# Patient Record
Sex: Female | Born: 1965
Health system: Southern US, Community
[De-identification: ages and names within clinical notes are randomized; demographics above are authoritative.]

## PROBLEM LIST (undated history)

## (undated) DIAGNOSIS — Z8489 Family history of other specified conditions: Secondary | ICD-10-CM

## (undated) DIAGNOSIS — I10 Essential (primary) hypertension: Secondary | ICD-10-CM

## (undated) DIAGNOSIS — E785 Hyperlipidemia, unspecified: Secondary | ICD-10-CM

## (undated) HISTORY — PX: ABDOMINAL HYSTERECTOMY: SHX81

## (undated) HISTORY — PX: KNEE ARTHROPLASTY: SHX992

---

## 1989-04-16 HISTORY — PX: ECTOPIC PREGNANCY SURGERY: SHX613

## 2005-07-16 ENCOUNTER — Ambulatory Visit: Payer: Self-pay | Admitting: Internal Medicine

## 2007-02-03 ENCOUNTER — Ambulatory Visit: Payer: Self-pay

## 2007-02-25 ENCOUNTER — Ambulatory Visit: Payer: Self-pay | Admitting: Orthopaedic Surgery

## 2007-03-04 ENCOUNTER — Ambulatory Visit: Payer: Self-pay | Admitting: Orthopaedic Surgery

## 2007-10-23 ENCOUNTER — Ambulatory Visit: Payer: Self-pay | Admitting: Nurse Practitioner

## 2007-11-05 ENCOUNTER — Ambulatory Visit: Payer: Self-pay | Admitting: Nurse Practitioner

## 2009-11-29 ENCOUNTER — Ambulatory Visit: Payer: Self-pay | Admitting: Nurse Practitioner

## 2012-06-25 ENCOUNTER — Ambulatory Visit: Payer: Self-pay | Admitting: Internal Medicine

## 2012-08-25 ENCOUNTER — Ambulatory Visit: Payer: Self-pay | Admitting: Internal Medicine

## 2014-02-14 ENCOUNTER — Ambulatory Visit: Payer: Self-pay | Admitting: Family Medicine

## 2014-02-26 ENCOUNTER — Ambulatory Visit: Payer: Self-pay | Admitting: Family Medicine

## 2014-08-03 ENCOUNTER — Ambulatory Visit
Admit: 2014-08-03 | Disposition: A | Discharge status: Home / Self Care | Payer: Self-pay | Attending: Family Medicine | Admitting: Family Medicine

## 2014-08-04 DIAGNOSIS — I10 Essential (primary) hypertension: Secondary | ICD-10-CM | POA: Insufficient documentation

## 2015-01-24 ENCOUNTER — Encounter: Payer: Self-pay | Admitting: Physician Assistant

## 2015-01-24 ENCOUNTER — Ambulatory Visit: Payer: Self-pay | Admitting: Physician Assistant

## 2015-01-24 VITALS — BP 120/82 | Temp 99.1°F | Wt 137.0 lb

## 2015-01-24 DIAGNOSIS — J069 Acute upper respiratory infection, unspecified: Secondary | ICD-10-CM

## 2015-01-24 MED ORDER — AZITHROMYCIN 250 MG PO TABS: ORAL_TABLET | ORAL | Status: DC

## 2015-01-24 NOTE — Progress Notes (Signed)
S: C/o headache and dry cough for 3 days, no fever, chills, cp/sob, v/d; mucus  thick, cough is sporadic, c/o of facial and dental pain. Had flu shot last week  Using otc meds:   O: PE: perrl eomi, normocephalic, tms dull, nasal mucosa red and swollen, throat injected, neck supple no lymph, lungs c t a, cv rrr, neuro intact  A:  Acute sinusitis   P: zpack, drink fluids, continue regular meds , use otc meds of choice, return if not improving in 5 days, return earlier if worsening

## 2015-05-21 ENCOUNTER — Ambulatory Visit
Admission: EM | Admit: 2015-05-21 | Discharge: 2015-05-21 | Disposition: A | Discharge status: Home / Self Care | Payer: 59 | Attending: Family Medicine | Admitting: Family Medicine

## 2015-05-21 DIAGNOSIS — N390 Urinary tract infection, site not specified: Secondary | ICD-10-CM | POA: Diagnosis not present

## 2015-05-21 LAB — URINALYSIS COMPLETE WITH MICROSCOPIC (ARMC ONLY)
BILIRUBIN URINE: NEGATIVE
GLUCOSE, UA: NEGATIVE mg/dL
Ketones, ur: NEGATIVE mg/dL
NITRITE: NEGATIVE
PH: 8.5 — AB (ref 5.0–8.0)
Protein, ur: 30 mg/dL — AB
SPECIFIC GRAVITY, URINE: 1.02 (ref 1.005–1.030)
Squamous Epithelial / LPF: NONE SEEN

## 2015-05-21 MED ORDER — NITROFURANTOIN MONOHYD MACRO 100 MG PO CAPS: 100.0000 mg | ORAL_CAPSULE | Freq: Two times a day (BID) | ORAL | Status: DC

## 2015-05-21 NOTE — ED Provider Notes (Signed)
[facility]  ____________________________________________  Time seen: Approximately 9:25 AM  I have reviewed the triage vital signs and the nursing notes.   HISTORY  Chief Complaint Urinary Tract Infection   HPI Barbara Welch is a 50 y.o. female presents for complaints of 2 days of urinary frequency, urinary urgency and occasional burning with urination. Also reports that she noticed some blood in urination. Denies other abnormal bleeding. States that she occasionally has some pressure in lower pelvis but denies pain. Denies pain or discomfort in absence of actively urinating. Denies fever. Reports has continued to eat and drink well. Patient reports she has had one urinary tract infection in the past several years ago and it felt similar. Denies pain at this time.  Denies chest pain or shortness breath, abdominal pain, fevers, back pain, flank pain, trauma. Denies recent antibiotic use. Denies known trigger for urinary tract infection.   No past medical history on file.  There are no active problems to display for this patient.   Surgical history on file.  Hysterectomy    Current Outpatient Rx  Name  Route  Sig  Dispense  Refill  . amLODipine (NORVASC) 10 MG tablet   Oral   Take 10 mg by mouth daily.         Marland Kitchen aspirin EC 81 MG tablet   Oral   Take 81 mg by mouth daily.         Marland Kitchen atorvastatin (LIPITOR) 10 MG tablet   Oral   Take 10 mg by mouth daily.         . calcium citrate-vitamin D (CITRACAL+D) 315-200 MG-UNIT tablet   Oral   Take 1 tablet by mouth 2 (two) times daily.         . hydrochlorothiazide (HYDRODIURIL) 12.5 MG tablet   Oral   Take 12.5 mg by mouth daily.         .           .             Allergies Review of patient's allergies indicates no known allergies.  No family history on file.  Social History Social History  Substance Use Topics  . Smoking status: Former Games developer  . Smokeless tobacco: None  . Alcohol Use: 0.0  oz/week    0 Standard drinks or equivalent per week     Comment: occasional    Review of Systems Constitutional: No fever/chills Eyes: No visual changes. ENT: No sore throat. Cardiovascular: Denies chest pain. Respiratory: Denies shortness of breath. Gastrointestinal: No abdominal pain.  No nausea, no vomiting.  No diarrhea.  No constipation. Genitourinary: positive for dysuria. Musculoskeletal: Negative for back pain. Skin: Negative for rash. Neurological: Negative for headaches, focal weakness or numbness.  10-point ROS otherwise negative.  ____________________________________________   PHYSICAL EXAM:  VITAL SIGNS: ED Triage Vitals  Enc Vitals Group     BP 05/21/15 0915 114/76 mmHg     Pulse Rate 05/21/15 0915 73     Resp 05/21/15 0915 16     Temp 05/21/15 0915 98.4 F (36.9 C)     Temp Source 05/21/15 0915 Oral     SpO2 05/21/15 0915 100 %     Weight 05/21/15 0915 137 lb (62.143 kg)     Height 05/21/15 0915  (1.651 m)     Head Cir --      Peak Flow --      Pain Score 05/21/15 0917 3     Pain  Loc --      Pain Edu? --      Excl. in GC? --     Constitutional: Alert and oriented. Well appearing and in no acute distress. Eyes: Conjunctivae are normal. PERRL. EOMI. Head: Atraumatic.  Nose: No congestion/rhinnorhea.  Mouth/Throat: Mucous membranes are moist.   Neck: No stridor.  No cervical spine tenderness to palpation. Hematological/Lymphatic/Immunilogical: No cervical lymphadenopathy. Cardiovascular: Normal rate, regular rhythm. Grossly normal heart sounds.  Good peripheral circulation. Respiratory: Normal respiratory effort.  No retractions. Lungs CTAB. Gastrointestinal: Soft and nontender. No distention. Normal Bowel sounds. No CVA tenderness. Musculoskeletal: No lower or upper extremity tenderness nor edema.  Neurologic:  Normal speech and language. No gross focal neurologic deficits are appreciated. No gait instability. Skin:  Skin is warm, dry and  intact. No rash noted. Psychiatric: Mood and affect are normal. Speech and behavior are normal.  ____________________________________________   LABS (all labs ordered are listed, but only abnormal results are displayed)  Labs Reviewed  URINALYSIS COMPLETEWITH MICROSCOPIC ([facility] ONLY) - Abnormal; Notable for the following:    APPearance CLOUDY (*)    Hgb urine dipstick 3+ (*)    pH 8.5 (*)    Protein, ur 30 (*)    Leukocytes, UA 2+ (*)    Bacteria, UA FEW (*)    All other components within normal limits  URINE CULTURE     INITIAL IMPRESSION / ASSESSMENT AND PLAN / ED COURSE  Pertinent labs & imaging results that were available during my care of the patient were reviewed by me and considered in my medical decision making (see chart for details).  Very well-appearing patient. No acute distress. Presents for the complaints of 2 days of dysuria. Lungs clear throughout. Abdomen soft and nontender. No CVA tenderness. Urinalysis positive for few bacteria, 2+ leukocytes, cloudy appearance, 3+ hemoglobin, 30 protein and no epithelial cells. Will culture urine. Encouraged rest, fluids. Will treat with oral Macrobid. Encourage PCP follow-up to ensure resolution in 1 week.  Discussed follow up with Primary care physician this week. Discussed follow up and return parameters including no resolution or any worsening concerns. Patient verbalized understanding and agreed to plan.   ____________________________________________   FINAL CLINICAL IMPRESSION(S) / ED DIAGNOSES  Final diagnoses:  UTI (lower urinary tract infection)      Note: This dictation was prepared with Dragon dictation along with smaller phrase technology. Any transcriptional errors that result from this process are unintentional.    Renford Dills, NP 05/21/15 [phone]

## 2015-05-21 NOTE — ED Notes (Signed)
Frequency, urgency, hematuria, dysuria x 2 days. Denies fever and flank pain.

## 2015-05-21 NOTE — Discharge Instructions (Signed)
Take medication as prescribed. Rest. Drink plenty of fluids.  ° °Follow up with your primary care physician this week as needed. Return to Urgent care for new or worsening concerns.  ° ° °Urinary Tract Infection °Urinary tract infections (UTIs) can develop anywhere along your urinary tract. Your urinary tract is your body's drainage system for removing wastes and extra water. Your urinary tract includes two kidneys, two ureters, a bladder, and a urethra. Your kidneys are a pair of bean-shaped organs. Each kidney is about the size of your fist. They are located below your ribs, one on each side of your spine. °CAUSES °Infections are caused by microbes, which are microscopic organisms, including fungi, viruses, and bacteria. These organisms are so small that they can only be seen through a microscope. Bacteria are the microbes that most commonly cause UTIs. °SYMPTOMS  °Symptoms of UTIs may vary by age and gender of the patient and by the location of the infection. Symptoms in young women typically include a frequent and intense urge to urinate and a painful, burning feeling in the bladder or urethra during urination. Older women and men are more likely to be tired, shaky, and weak and have muscle aches and abdominal pain. A fever may mean the infection is in your kidneys. Other symptoms of a kidney infection include pain in your back or sides below the ribs, nausea, and vomiting. °DIAGNOSIS °To diagnose a UTI, your caregiver will ask you about your symptoms. Your caregiver will also ask you to provide a urine sample. The urine sample will be tested for bacteria and white blood cells. White blood cells are made by your body to help fight infection. °TREATMENT  °Typically, UTIs can be treated with medication. Because most UTIs are caused by a bacterial infection, they usually can be treated with the use of antibiotics. The choice of antibiotic and length of treatment depend on your symptoms and the type of bacteria  causing your infection. °HOME CARE INSTRUCTIONS °· If you were prescribed antibiotics, take them exactly as your caregiver instructs you. Finish the medication even if you feel better after you have only taken some of the medication. °· Drink enough water and fluids to keep your urine clear or pale yellow. °· Avoid caffeine, tea, and carbonated beverages. They tend to irritate your bladder. °· Empty your bladder often. Avoid holding urine for long periods of time. °· Empty your bladder before and after sexual intercourse. °· After a bowel movement, women should cleanse from front to back. Use each tissue only once. °SEEK MEDICAL CARE IF:  °· You have back pain. °· You develop a fever. °· Your symptoms do not begin to resolve within 3 days. °SEEK IMMEDIATE MEDICAL CARE IF:  °· You have severe back pain or lower abdominal pain. °· You develop chills. °· You have nausea or vomiting. °· You have continued burning or discomfort with urination. °MAKE SURE YOU:  °· Understand these instructions. °· Will watch your condition. °· Will get help right away if you are not doing well or get worse. °  °This information is not intended to replace advice given to you by your health care provider. Make sure you discuss any questions you have with your health care provider. °  °Document Released: 01/10/2005 Document Revised: 12/22/2014 Document Reviewed: 05/11/2011 °Elsevier Interactive Patient Education ©2016 [employer] ° °

## 2015-05-23 LAB — URINE CULTURE

## 2015-07-26 DIAGNOSIS — I10 Essential (primary) hypertension: Secondary | ICD-10-CM | POA: Diagnosis not present

## 2015-07-26 DIAGNOSIS — E7800 Pure hypercholesterolemia, unspecified: Secondary | ICD-10-CM | POA: Diagnosis not present

## 2015-08-09 DIAGNOSIS — R945 Abnormal results of liver function studies: Secondary | ICD-10-CM | POA: Diagnosis not present

## 2015-08-09 DIAGNOSIS — R7989 Other specified abnormal findings of blood chemistry: Secondary | ICD-10-CM | POA: Insufficient documentation

## 2015-08-09 DIAGNOSIS — E7800 Pure hypercholesterolemia, unspecified: Secondary | ICD-10-CM | POA: Diagnosis not present

## 2015-08-09 DIAGNOSIS — I10 Essential (primary) hypertension: Secondary | ICD-10-CM | POA: Diagnosis not present

## 2015-08-09 DIAGNOSIS — Z1231 Encounter for screening mammogram for malignant neoplasm of breast: Secondary | ICD-10-CM | POA: Diagnosis not present

## 2015-11-23 ENCOUNTER — Telehealth: Payer: 59 | Admitting: Family

## 2015-11-23 DIAGNOSIS — N390 Urinary tract infection, site not specified: Secondary | ICD-10-CM

## 2015-11-23 MED ORDER — SULFAMETHOXAZOLE-TRIMETHOPRIM 800-160 MG PO TABS: 1.0000 | ORAL_TABLET | Freq: Two times a day (BID) | ORAL | 0 refills | Status: DC

## 2015-11-23 NOTE — Progress Notes (Signed)

## 2016-01-29 ENCOUNTER — Encounter: Payer: Self-pay | Admitting: Gynecology

## 2016-01-29 ENCOUNTER — Ambulatory Visit
Admission: EM | Admit: 2016-01-29 | Discharge: 2016-01-29 | Disposition: A | Discharge status: Home / Self Care | Payer: 59 | Attending: Family Medicine | Admitting: Family Medicine

## 2016-01-29 DIAGNOSIS — M545 Low back pain, unspecified: Secondary | ICD-10-CM

## 2016-01-29 HISTORY — DX: Essential (primary) hypertension: I10

## 2016-01-29 HISTORY — DX: Hyperlipidemia, unspecified: E78.5

## 2016-01-29 LAB — URINALYSIS COMPLETE WITH MICROSCOPIC (ARMC ONLY)
Bacteria, UA: NONE SEEN
Bilirubin Urine: NEGATIVE
Glucose, UA: NEGATIVE mg/dL
Hgb urine dipstick: NEGATIVE
Ketones, ur: NEGATIVE mg/dL
LEUKOCYTES UA: NEGATIVE
Nitrite: NEGATIVE
Protein, ur: NEGATIVE mg/dL
RBC / HPF: NONE SEEN RBC/hpf (ref 0–5)
SPECIFIC GRAVITY, URINE: 1.015 (ref 1.005–1.030)
pH: 8.5 — ABNORMAL HIGH (ref 5.0–8.0)

## 2016-01-29 MED ORDER — PREDNISONE 10 MG PO TABS: ORAL_TABLET | ORAL | 0 refills | Status: DC

## 2016-01-29 MED ORDER — CYCLOBENZAPRINE HCL 5 MG PO TABS: 5.0000 mg | ORAL_TABLET | Freq: Three times a day (TID) | ORAL | 0 refills | Status: DC | PRN

## 2016-01-29 NOTE — Discharge Instructions (Signed)
Take medication as prescribed. Rest. Drink plenty of fluids. Stretch.   Follow up with your primary care physician this week.  Return to Urgent care for new or worsening concerns.   

## 2016-01-29 NOTE — ED Provider Notes (Addendum)
[facility] ____________________________________________  Time seen: Approximately 8:50 AM  I have reviewed the triage vital signs and the nursing notes.   HISTORY  Chief Complaint Back pain   HPI Barbara Welch is a 50 y.o. female presents with a complaint of back pain. Patient reports she's had back pain for approximately 4 weeks. Patient states about 5 weeks ago she was treated for UTI. Patient is at the end of her course for treatment of UTI she started to have some back pain. Patient reports she initially thought that this is related to her UTI symptoms but reports continues with back pain. Patient reports occasionally feels like she is urinating more frequently, but denies any urgency, burning with urination or vaginal complaints. Denies abdominal pain or flank pain. Patient states back pain is primarily with movement. Reports has been taking over the counter tylenol and ibuprofen with minimal to mild help.   patient denies any fall, injury or trauma. Denies any strenuous activity. Patient reports the back pain has been persistent in the entire time, but states intermittently she will have a catching spasming pain. Patient states that this increased pain occurs with movement, primarily twisting. Denies history of back issues.  Denies pain radiation. Denies numbness or tingling sensation. Denies abdominal pain, chest pain, shortness of breath, fevers, dysuria, weakness or dizziness. Reports continues to eat and drink well. Reports has continued to remain active and work.  Leim Fabry, MD PCP No LMP recorded. Patient has had a hysterectomy.   Past Medical History:  Diagnosis Date  . Hyperlipemia   . Hypertension   kidney stone once in 1992, denies since  There are no active problems to display for this patient.   Past Surgical History:  Procedure Laterality Date  . ABDOMINAL HYSTERECTOMY    . KNEE ARTHROPLASTY Right     Current Outpatient Rx  . Order  #: [id number] Class: Historical Med  . Order #: [id number] Class: Historical Med  . Order #: [id number] Class: Historical Med  . Order #: [id number] Class: Historical Med  . Order #: [id number] Class: Historical Med  . Order #: [id number] Class: Historical Med  . Order #: [id number] Class: Normal  . Order #: [id number] Class: Normal  . Order #: [id number] Class: Historical Med  . Order #: [id number] Class: Normal  . Order #: [id number] Class: Normal  . Order #: [id number] Class: Normal    No current facility-administered medications for this encounter.   Current Outpatient Prescriptions:  .  amLODipine (NORVASC) 10 MG tablet, Take 10 mg by mouth daily., Disp: , Rfl:  .  aspirin EC 81 MG tablet, Take 81 mg by mouth daily., Disp: , Rfl:  .  atorvastatin (LIPITOR) 10 MG tablet, Take 10 mg by mouth daily., Disp: , Rfl:  .  calcium citrate-vitamin D (CITRACAL+D) 315-200 MG-UNIT tablet, Take 1 tablet by mouth 2 (two) times daily., Disp: , Rfl:  .  hydrochlorothiazide (HYDRODIURIL) 12.5 MG tablet, Take 12.5 mg by mouth daily., Disp: , Rfl:  .  Omega-3 Fatty Acids (FISH OIL CONCENTRATE PO), Take by mouth., Disp: , Rfl:  .  azithromycin (ZITHROMAX Z-PAK) 250 MG tablet, Use 2 pills today then 1 pill per day for 4 days, Disp: 6 each, Rfl: 0 .  cyclobenzaprine (FLEXERIL) 5 MG tablet, Take 1 tablet (5 mg total) by mouth 3 (three) times daily as needed for muscle spasms. Do not drive while taking, Disp: 15 tablet, Rfl: 0 .  meloxicam (MOBIC) 7.5 MG tablet, Take 7.5 mg by mouth daily., Disp: , Rfl:  .  nitrofurantoin, macrocrystal-monohydrate, (  MACROBID) 100 MG capsule, Take 1 capsule (100 mg total) by mouth 2 (two) times daily., Disp: 10 capsule, Rfl: 0 .  predniSONE (DELTASONE) 10 MG tablet, Start 60 mg po day one, then 50 mg po day two, taper by 10 mg daily until complete., Disp: 21 tablet, Rfl: 0 .  sulfamethoxazole-trimethoprim (BACTRIM DS) 800-160 MG tablet, Take 1 tablet by mouth 2 (two) times daily., Disp: 14 tablet,  Rfl: 0  Allergies Review of patient's allergies indicates no known allergies.  No family history on file.  Social History Social History  Substance Use Topics  . Smoking status: Former Games developer  . Smokeless tobacco: Never Used  . Alcohol use 0.0 oz/week     Comment: occasional    Review of Systems Constitutional: No fever/chills Eyes: No visual changes. ENT: No sore throat. Cardiovascular: Denies chest pain. Respiratory: Denies shortness of breath. Gastrointestinal: No abdominal pain.  No nausea, no vomiting.  No diarrhea.  No constipation. Genitourinary: Negative for dysuria. Musculoskeletal: positive for back pain. Skin: Negative for rash. Neurological: Negative for headaches, focal weakness or numbness.  10-point ROS otherwise negative.  ____________________________________________   PHYSICAL EXAM:  VITAL SIGNS: ED Triage Vitals  Enc Vitals Group     BP 01/29/16 0823 124/72     Pulse Rate 01/29/16 0823 85     Resp 01/29/16 0823 16     Temp 01/29/16 0823 98.2 F (36.8 C)     Temp Source 01/29/16 0823 Oral     SpO2 01/29/16 0823 100 %     Weight 01/29/16 0825 129 lb (58.5 kg)     Height 01/29/16 0825 5\' 4"  (1.626 m)     Head Circumference --      Peak Flow --      Pain Score 01/29/16 0829 7     Pain Loc --      Pain Edu? --      Excl. in GC? --     Constitutional: Alert and oriented. Well appearing and in no acute distress. Eyes: Conjunctivae are normal. PERRL. EOMI. ENT      Head: Normocephalic and atraumatic.      Nose: No congestion/rhinnorhea.      Mouth/Throat: Mucous membranes are moist. Cardiovascular: Normal rate, regular rhythm. Grossly normal heart sounds.  Good peripheral circulation. Respiratory: Normal respiratory effort without tachypnea nor retractions. Breath sounds are clear and equal bilaterally. No wheezes/rales/rhonchi.. Gastrointestinal: Soft and nontender. No distention. Normal Bowel sounds. No CVA tenderness. Musculoskeletal:   Nontender with normal range of motion in all extremities. No midline cervical, thoracic or lumbar tenderness to palpation. Bilateral pedal pulses equal and easily palpated. No pain with bilateral standing knee lifts.  Except: Mild point tenderness to left left lower parathoracic and upper paralumbar tenderness to direct palpation as well as palpable muscle spasms with left and right rotation, Full range of motion present, No midline tenderness, no saddle anesthesia. Changes positions quickly in room.      Right lower leg:  No tenderness or edema.      Left lower leg:  No tenderness or edema.  Neurologic:  Normal speech and language. No gross focal neurologic deficits are appreciated. Speech is normal. No gait instability.  Skin:  Skin is warm, dry and intact. No rash noted. Psychiatric: Mood and affect are normal. Speech and behavior are normal. Patient exhibits appropriate insight and judgment   ___________________________________________   LABS (all labs ordered are listed, but only abnormal results are displayed)  Labs Reviewed  URINALYSIS COMPLETEWITH  MICROSCOPIC ([facility] ONLY) - Abnormal; Notable for the following:       Result Value   pH 8.5 (*)    Squamous Epithelial / LPF 0-5 (*)    All other components within normal limits  URINE CULTURE   ____________________________________________  RADIOLOGY  Deferred.   PROCEDURES Procedures   INITIAL IMPRESSION / ASSESSMENT AND PLAN / ED COURSE  Pertinent labs & imaging results that were available during my care of the patient were reviewed by me and considered in my medical decision making (see chart for details).  Very well-appearing patient. No acute distress. Presents for the complaints of lower back pain. Patient reports pain is primarily with movement and fully reproducible. On exam patient with complaint tender left lower parathoracic and upper paralumbar tenderness to direct palpation as well as palpable muscle spasms with left  and right rotation. Full range of motion present. No midline tenderness. Urinalysis discussed in detail with patient. No bacteria, amorphous crystals present however no RBCs or hemoglobin present without stone as well as pain is fully reproducible and palpable spasm.  . Discussed evaluation with radiology including thoracic and lumbar series or KUB, patient declined x-rays at this time. Will treat conservatively with medication management including oral prednisone and when necessary Flexeril. Discussed stretching and supportive care. Encourage close primary care follow-up.Discussed indication, risks and benefits of medications with patient.  Discussed follow up with Primary care physician this week. Discussed follow up and return parameters including no resolution or any worsening concerns. Patient verbalized understanding and agreed to plan.   ____________________________________________   FINAL CLINICAL IMPRESSION(S) / ED DIAGNOSES  Final diagnoses:  Acute left-sided low back pain without sciatica     Discharge Medication List as of 01/29/2016  9:12 AM    START taking these medications   Details  cyclobenzaprine (FLEXERIL) 5 MG tablet Take 1 tablet (5 mg total) by mouth 3 (three) times daily as needed for muscle spasms. Do not drive while taking, Starting Sun 01/29/2016, Normal    predniSONE (DELTASONE) 10 MG tablet Start 60 mg po day one, then 50 mg po day two, taper by 10 mg daily until complete., Normal        Note: This dictation was prepared with Dragon dictation along with smaller phrase technology. Any transcriptional errors that result from this process are unintentional.    Clinical Course      Renford DillsLindsey Aasim Restivo, NP 01/29/16 1007    Renford DillsLindsey Jamice Carreno, NP 01/29/16 1008    Renford DillsLindsey Azarian Starace, NP 01/29/16 1035

## 2016-01-29 NOTE — ED Triage Notes (Signed)
Per patient was diagnose with a UTI on august 9th and is having the same symptoms. Patient c/o frequent urination and low back pain x 4 days ago.

## 2016-01-31 LAB — URINE CULTURE: Culture: NO GROWTH

## 2016-02-01 ENCOUNTER — Telehealth: Payer: Self-pay | Admitting: *Deleted

## 2016-02-01 NOTE — Telephone Encounter (Signed)
Called patient, verified DOB, communicated negative urine culture result. Patient reported feeling about the same and has a follow up appointment with her PCP on 02/16/16. Advised patient to follow up sooner with PCP or [facility] if symptoms suddenly become worse.

## 2016-02-06 DIAGNOSIS — I10 Essential (primary) hypertension: Secondary | ICD-10-CM | POA: Diagnosis not present

## 2016-02-06 DIAGNOSIS — E7800 Pure hypercholesterolemia, unspecified: Secondary | ICD-10-CM | POA: Diagnosis not present

## 2016-02-16 DIAGNOSIS — M545 Low back pain: Secondary | ICD-10-CM | POA: Diagnosis not present

## 2016-02-16 DIAGNOSIS — Z1231 Encounter for screening mammogram for malignant neoplasm of breast: Secondary | ICD-10-CM | POA: Diagnosis not present

## 2016-02-16 DIAGNOSIS — Z0000 Encounter for general adult medical examination without abnormal findings: Secondary | ICD-10-CM | POA: Diagnosis not present

## 2016-02-20 DIAGNOSIS — H52223 Regular astigmatism, bilateral: Secondary | ICD-10-CM | POA: Diagnosis not present

## 2016-06-20 ENCOUNTER — Other Ambulatory Visit: Payer: Self-pay | Admitting: Family Medicine

## 2016-06-20 DIAGNOSIS — Z1231 Encounter for screening mammogram for malignant neoplasm of breast: Secondary | ICD-10-CM

## 2016-07-13 ENCOUNTER — Ambulatory Visit
Admission: RE | Admit: 2016-07-13 | Discharge: 2016-07-13 | Disposition: A | Discharge status: Home / Self Care | Payer: 59 | Source: Ambulatory Visit | Attending: Family Medicine | Admitting: Family Medicine

## 2016-07-13 DIAGNOSIS — Z1231 Encounter for screening mammogram for malignant neoplasm of breast: Secondary | ICD-10-CM | POA: Diagnosis not present

## 2016-07-14 ENCOUNTER — Telehealth: Payer: 59 | Admitting: Family

## 2016-07-14 DIAGNOSIS — N390 Urinary tract infection, site not specified: Secondary | ICD-10-CM | POA: Diagnosis not present

## 2016-07-14 MED ORDER — CEPHALEXIN 500 MG PO CAPS: 500.0000 mg | ORAL_CAPSULE | Freq: Two times a day (BID) | ORAL | 0 refills | Status: DC

## 2016-07-14 NOTE — Progress Notes (Signed)

## 2016-08-15 DIAGNOSIS — I10 Essential (primary) hypertension: Secondary | ICD-10-CM | POA: Diagnosis not present

## 2016-09-18 ENCOUNTER — Other Ambulatory Visit: Payer: Self-pay | Admitting: Family Medicine

## 2016-09-18 DIAGNOSIS — N390 Urinary tract infection, site not specified: Secondary | ICD-10-CM | POA: Diagnosis not present

## 2016-09-18 DIAGNOSIS — M8580 Other specified disorders of bone density and structure, unspecified site: Secondary | ICD-10-CM | POA: Diagnosis not present

## 2016-09-18 DIAGNOSIS — R7989 Other specified abnormal findings of blood chemistry: Secondary | ICD-10-CM | POA: Diagnosis not present

## 2016-09-18 DIAGNOSIS — E7800 Pure hypercholesterolemia, unspecified: Secondary | ICD-10-CM | POA: Diagnosis not present

## 2016-09-24 DIAGNOSIS — Z1211 Encounter for screening for malignant neoplasm of colon: Secondary | ICD-10-CM | POA: Diagnosis not present

## 2016-10-15 ENCOUNTER — Ambulatory Visit
Admission: RE | Admit: 2016-10-15 | Discharge: 2016-10-15 | Disposition: A | Discharge status: Home / Self Care | Payer: 59 | Source: Ambulatory Visit | Attending: Family Medicine | Admitting: Family Medicine

## 2016-10-15 DIAGNOSIS — M8588 Other specified disorders of bone density and structure, other site: Secondary | ICD-10-CM | POA: Insufficient documentation

## 2016-10-15 DIAGNOSIS — M8580 Other specified disorders of bone density and structure, unspecified site: Secondary | ICD-10-CM | POA: Diagnosis not present

## 2016-11-02 ENCOUNTER — Telehealth: Payer: 59 | Admitting: Family

## 2016-11-02 DIAGNOSIS — N390 Urinary tract infection, site not specified: Secondary | ICD-10-CM | POA: Diagnosis not present

## 2016-11-02 MED ORDER — CEPHALEXIN 500 MG PO CAPS: 500.0000 mg | ORAL_CAPSULE | Freq: Two times a day (BID) | ORAL | 0 refills | Status: DC

## 2016-11-02 NOTE — Progress Notes (Signed)
Thank you for the details you put in the comment boxes. Those details really help us take better care of you.   We are sorry that you are not feeling well.  Here is how we plan to help!  Based on what you shared with me it looks like you most likely have a simple urinary tract infection.  A UTI (Urinary Tract Infection) is a bacterial infection of the bladder.  Most cases of urinary tract infections are simple to treat but a key part of your care is to encourage you to drink plenty of fluids and watch your symptoms carefully.  I have prescribed Keflex 500 mg twice a day for 7 days.  Your symptoms should gradually improve. Call us if the burning in your urine worsens, you develop worsening fever, back pain or pelvic pain or if your symptoms do not resolve after completing the antibiotic.  Urinary tract infections can be prevented by drinking plenty of water to keep your body hydrated.  Also be sure when you wipe, wipe from front to back and don't hold it in!  If possible, empty your bladder every 4 hours.  Your e-visit answers were reviewed by a board certified advanced clinical practitioner to complete your personal care plan.  Depending on the condition, your plan could have included both over the counter or prescription medications.  If there is a problem please reply  once you have received a response from your provider.  Your safety is important to us.  If you have drug allergies check your prescription carefully.    You can use MyChart to ask questions about today's visit, request a non-urgent call back, or ask for a work or school excuse for 24 hours related to this e-Visit. If it has been greater than 24 hours you will need to follow up with your provider, or enter a new e-Visit to address those concerns.   You will get an e-mail in the next two days asking about your experience.  I hope that your e-visit has been valuable and will speed your recovery. Thank you for using  e-visits.    

## 2017-04-01 DIAGNOSIS — Z0000 Encounter for general adult medical examination without abnormal findings: Secondary | ICD-10-CM | POA: Diagnosis not present

## 2017-04-01 DIAGNOSIS — I10 Essential (primary) hypertension: Secondary | ICD-10-CM | POA: Diagnosis not present

## 2017-04-01 DIAGNOSIS — Z1231 Encounter for screening mammogram for malignant neoplasm of breast: Secondary | ICD-10-CM | POA: Diagnosis not present

## 2017-04-01 DIAGNOSIS — M8588 Other specified disorders of bone density and structure, other site: Secondary | ICD-10-CM | POA: Insufficient documentation

## 2017-04-01 DIAGNOSIS — Z87442 Personal history of urinary calculi: Secondary | ICD-10-CM | POA: Insufficient documentation

## 2017-04-01 DIAGNOSIS — L659 Nonscarring hair loss, unspecified: Secondary | ICD-10-CM | POA: Diagnosis not present

## 2017-04-01 DIAGNOSIS — E7800 Pure hypercholesterolemia, unspecified: Secondary | ICD-10-CM | POA: Diagnosis not present

## 2017-05-30 ENCOUNTER — Other Ambulatory Visit: Payer: Self-pay | Admitting: Family Medicine

## 2017-05-30 DIAGNOSIS — Z1231 Encounter for screening mammogram for malignant neoplasm of breast: Secondary | ICD-10-CM

## 2017-07-15 ENCOUNTER — Ambulatory Visit
Admission: RE | Admit: 2017-07-15 | Discharge: 2017-07-15 | Disposition: A | Discharge status: Home / Self Care | Payer: 59 | Source: Ambulatory Visit | Attending: Family Medicine | Admitting: Family Medicine

## 2017-07-15 DIAGNOSIS — Z1231 Encounter for screening mammogram for malignant neoplasm of breast: Secondary | ICD-10-CM | POA: Diagnosis not present

## 2017-08-05 DIAGNOSIS — R1084 Generalized abdominal pain: Secondary | ICD-10-CM | POA: Diagnosis not present

## 2017-08-05 DIAGNOSIS — Z1211 Encounter for screening for malignant neoplasm of colon: Secondary | ICD-10-CM | POA: Diagnosis not present

## 2017-08-14 DIAGNOSIS — R1084 Generalized abdominal pain: Secondary | ICD-10-CM | POA: Diagnosis not present

## 2017-10-04 ENCOUNTER — Ambulatory Visit (INDEPENDENT_AMBULATORY_CARE_PROVIDER_SITE_OTHER): Payer: Self-pay | Admitting: Family Medicine

## 2017-10-04 VITALS — BP 118/82 | HR 93 | Temp 98.6°F | Resp 17

## 2017-10-04 DIAGNOSIS — R059 Cough, unspecified: Secondary | ICD-10-CM

## 2017-10-04 DIAGNOSIS — R05 Cough: Secondary | ICD-10-CM

## 2017-10-04 DIAGNOSIS — J309 Allergic rhinitis, unspecified: Secondary | ICD-10-CM

## 2017-10-04 MED ORDER — BENZONATATE 100 MG PO CAPS: 100.0000 mg | ORAL_CAPSULE | Freq: Three times a day (TID) | ORAL | 0 refills | Status: DC | PRN

## 2017-10-04 MED ORDER — PREDNISONE 20 MG PO TABS: 20.0000 mg | ORAL_TABLET | Freq: Every day | ORAL | 0 refills | Status: AC

## 2017-10-04 NOTE — Progress Notes (Signed)
Patient ID: Barbara Welch, female    DOB: 01/05/1966, 52 y.o.   MRN: [id number]  PCP: Leim Fabry, MD  Chief Complaint  Patient presents with  . cough, sore throat    Subjective:  HPI Barbara Welch is a 52 y.o. female presents for evaluation of cough and sore throat. Cough, nonproductive, dry, accompanied by the sensation of drainage. experienced sore throat earlier this week which has resolved. Endorses a burning sensation with cough and nasal congestion. Denies fever, headache, body aches, wheezing, shortness of breath, or chest tightness. She intermittently takes oral antihistamines for seasonal allergies, however reports inconsistent use. No known sick contacts. Denies feeling physically ill at present. Social History   Socioeconomic History  . Marital status: Married    Spouse name: Not on file  . Number of children: Not on file  . Years of education: Not on file  . Highest education level: Not on file  Occupational History  . Not on file  Social Needs  . Financial resource strain: Not on file  . Food insecurity:    Worry: Not on file    Inability: Not on file  . Transportation needs:    Medical: Not on file    Non-medical: Not on file  Tobacco Use  . Smoking status: Former Games developer  . Smokeless tobacco: Never Used  Substance and Sexual Activity  . Alcohol use: Yes    Alcohol/week: 0.0 oz    Comment: occasional  . Drug use: Not on file  . Sexual activity: Not on file  Lifestyle  . Physical activity:    Days per week: Not on file    Minutes per session: Not on file  . Stress: Not on file  Relationships  . Social connections:    Talks on phone: Not on file    Gets together: Not on file    Attends religious service: Not on file    Active member of club or organization: Not on file    Attends meetings of clubs or organizations: Not on file    Relationship status: Not on file  . Intimate partner violence:    Fear of current or ex partner: Not on file   Emotionally abused: Not on file    Physically abused: Not on file    Forced sexual activity: Not on file  Other Topics Concern  . Not on file  Social History Narrative  . Not on file    Family History  Problem Relation Age of Onset  . Breast cancer Maternal Grandmother    Review of Systems Pertinent negatives listed in HPI   There are no active problems to display for this patient.  No Known Allergies  Prior to Admission medications   Medication Sig Start Date End Date Taking? Authorizing Provider  amLODipine (NORVASC) 10 MG tablet Take 10 mg by mouth daily.   Yes [provider]  aspirin EC 81 MG tablet Take 81 mg by mouth daily.   Yes [provider]  calcium citrate-vitamin D (CITRACAL+D) 315-200 MG-UNIT tablet Take 1 tablet by mouth 2 (two) times daily.   Yes [provider]  Omega-3 Fatty Acids (FISH OIL CONCENTRATE PO) Take by mouth.   Yes [provider]  atorvastatin (LIPITOR) 10 MG tablet Take 10 mg by mouth daily.    [provider]  azithromycin (ZITHROMAX Z-PAK) 250 MG tablet Use 2 pills today then 1 pill per day for 4 days Patient not taking: Reported on 10/04/2017 01/24/15  Faythe GheeFisher, Susan W, PA-C  cephALEXin (KEFLEX) 500 MG capsule Take 1 capsule (500 mg total) by mouth 2 (two) times daily. Patient not taking: Reported on 10/04/2017 11/02/16   Beau FannyWithrow, John C, FNP  cyclobenzaprine (FLEXERIL) 5 MG tablet Take 1 tablet (5 mg total) by mouth 3 (three) times daily as needed for muscle spasms. Do not drive while taking Patient not taking: Reported on 10/04/2017 01/29/16   Renford DillsMiller, Lindsey, NP  hydrochlorothiazide (HYDRODIURIL) 12.5 MG tablet Take 12.5 mg by mouth daily.    [provider]  meloxicam (MOBIC) 7.5 MG tablet Take 7.5 mg by mouth daily.    [provider]  nitrofurantoin, macrocrystal-monohydrate, (MACROBID) 100 MG capsule Take 1 capsule (100 mg total) by mouth 2 (two) times daily. Patient not taking:  Reported on 10/04/2017 05/21/15   Renford DillsMiller, Lindsey, NP  predniSONE (DELTASONE) 10 MG tablet Start 60 mg po day one, then 50 mg po day two, taper by 10 mg daily until complete. Patient not taking: Reported on 10/04/2017 01/29/16   Renford DillsMiller, Lindsey, NP  sulfamethoxazole-trimethoprim (BACTRIM DS) 800-160 MG tablet Take 1 tablet by mouth 2 (two) times daily. Patient not taking: Reported on 10/04/2017 11/23/15   Junie SpencerHawks, Christy A, FNP   Past Medical, Surgical Family and Social History reviewed and updated.    Objective:   Today's Vitals   10/04/17 0909  BP: 118/82  Pulse: 93  Resp: 17  Temp: 98.6 F (37 C)  TempSrc: Oral  SpO2: 96%    Wt Readings from Last 3 Encounters:  01/29/16 129 lb (58.5 kg)  05/21/15 137 lb (62.1 kg)  01/24/15 137 lb (62.1 kg)    Physical Exam  Constitutional: She appears well-developed and well-nourished.  HENT:  Nose: Mucosal edema and rhinorrhea present. Right sinus exhibits no maxillary sinus tenderness and no frontal sinus tenderness. Left sinus exhibits no maxillary sinus tenderness and no frontal sinus tenderness.  Mouth/Throat: Uvula is midline and oropharynx is clear and moist. No oropharyngeal exudate, posterior oropharyngeal edema or posterior oropharyngeal erythema.  Eyes: Pupils are equal, round, and reactive to light. EOM are normal.  Neck: Normal range of motion.  Cardiovascular: Normal rate, regular rhythm, normal heart sounds and intact distal pulses.  Pulmonary/Chest: Effort normal and breath sounds normal. She has no wheezes.  Lymphadenopathy:    She has no cervical adenopathy.  Skin: Skin is warm and dry.  Psychiatric: She has a normal mood and affect. Her behavior is normal. Judgment and thought content normal.   Assessment & Plan:  1. Allergic rhinitis, unspecified seasonality, unspecified trigger 2. Cough Symptoms consistent with viral allergic rhinitis which I suspect is the underlying source of cough. Will treat symptomatically only for  now (see medication orders). Patient advised is no significant improvement over the weekend, follow-up here or with PCP for consideration of antibiotic therapy.  Meds ordered this encounter  Medications  . predniSONE (DELTASONE) 20 MG tablet    Sig: Take 1 tablet (20 mg total) by mouth daily with breakfast for 5 days.    Dispense:  5 tablet    Refill:  0  . benzonatate (TESSALON) 100 MG capsule    Sig: Take 1-2 capsules (100-200 mg total) by mouth 3 (three) times daily as needed for cough.    Dispense:  60 capsule    Refill:  0    If symptoms worsen or do not improve, return for follow-up, follow-up with PCP, or at the emergency department if severity of symptoms warrant a higher level of  care.    Godfrey Pick. Tiburcio Pea, MSN, FNP-C [facility]  [address]  [place], [place] [address] [phone]

## 2017-10-04 NOTE — Patient Instructions (Addendum)
Resume Zyrtec with prednisone. Take benzonatate as prescribed for cough. If no improvement over the weekend, follow-up with PCP or here for further evaluation.    Allergic Rhinitis, Adult Allergic rhinitis is an allergic reaction that affects the mucous membrane inside the nose. It causes sneezing, a runny or stuffy nose, and the feeling of mucus going down the back of the throat (postnasal drip). Allergic rhinitis can be mild to severe. There are two types of allergic rhinitis:  Seasonal. This type is also called hay fever. It happens only during certain seasons.  Perennial. This type can happen at any time of the year.  What are the causes? This condition happens when the body's defense system (immune system) responds to certain harmless substances called allergens as though they were germs.  Seasonal allergic rhinitis is triggered by pollen, which can come from grasses, trees, and weeds. Perennial allergic rhinitis may be caused by:  House dust mites.  Pet dander.  Mold spores.  What are the signs or symptoms? Symptoms of this condition include:  Sneezing.  Runny or stuffy nose (nasal congestion).  Postnasal drip.  Itchy nose.  Tearing of the eyes.  Trouble sleeping.  Daytime sleepiness.  How is this diagnosed? This condition may be diagnosed based on:  Your medical history.  A physical exam.  Tests to check for related conditions, such as: ? Asthma. ? Pink eye. ? Ear infection. ? Upper respiratory infection.  Tests to find out which allergens trigger your symptoms. These may include skin or blood tests.  How is this treated? There is no cure for this condition, but treatment can help control symptoms. Treatment may include:  Taking medicines that block allergy symptoms, such as antihistamines. Medicine may be given as a shot, nasal spray, or pill.  Avoiding the allergen.  Desensitization. This treatment involves getting ongoing shots until your body  becomes less sensitive to the allergen. This treatment may be done if other treatments do not help.  If taking medicine and avoiding the allergen does not work, new, stronger medicines may be prescribed.  Follow these instructions at home:  Find out what you are allergic to. Common allergens include smoke, dust, and pollen.  Avoid the things you are allergic to. These are some things you can do to help avoid allergens: ? Replace carpet with wood, tile, or vinyl flooring. Carpet can trap dander and dust. ? Do not smoke. Do not allow smoking in your home. ? Change your heating and air conditioning filter at least once a month. ? During allergy season:  Keep windows closed as much as possible.  Plan outdoor activities when pollen counts are lowest. This is usually during the evening hours.  When coming indoors, change clothing and shower before sitting on furniture or bedding.  Take over-the-counter and prescription medicines only as told by your health care provider.  Keep all follow-up visits as told by your health care provider. This is important. Contact a health care provider if:  You have a fever.  You develop a persistent cough.  You make whistling sounds when you breathe (you wheeze).  Your symptoms interfere with your normal daily activities. Get help right away if:  You have shortness of breath. Summary  This condition can be managed by taking medicines as directed and avoiding allergens.  Contact your health care provider if you develop a persistent cough or fever.  During allergy season, keep windows closed as much as possible. This information is not intended to replace advice  given to you by your health care provider. Make sure you discuss any questions you have with your health care provider. Document Released: 12/26/2000 Document Revised: 05/10/2016 Document Reviewed: 05/10/2016 Elsevier Interactive Patient Education  2018 Elsevier Inc.  Cough, Adult A  cough helps to clear your throat and lungs. A cough may last only 2-3 weeks (acute), or it may last longer than 8 weeks (chronic). Many different things can cause a cough. A cough may be a sign of an illness or another medical condition. Follow these instructions at home:  Pay attention to any changes in your cough.  Take medicines only as told by your doctor. ? If you were prescribed an antibiotic medicine, take it as told by your doctor. Do not stop taking it even if you start to feel better. ? Talk with your doctor before you try using a cough medicine.  Drink enough fluid to keep your pee (urine) clear or pale yellow.  If the air is dry, use a cold steam vaporizer or humidifier in your home.  Stay away from things that make you cough at work or at home.  If your cough is worse at night, try using extra pillows to raise your head up higher while you sleep.  Do not smoke, and try not to be around smoke. If you need help quitting, ask your doctor.  Do not have caffeine.  Do not drink alcohol.  Rest as needed. Contact a doctor if:  You have new problems (symptoms).  You cough up yellow fluid (pus).  Your cough does not get better after 2-3 weeks, or your cough gets worse.  Medicine does not help your cough and you are not sleeping well.  You have pain that gets worse or pain that is not helped with medicine.  You have a fever.  You are losing weight and you do not know why.  You have night sweats. Get help right away if:  You cough up blood.  You have trouble breathing.  Your heartbeat is very fast. This information is not intended to replace advice given to you by your health care provider. Make sure you discuss any questions you have with your health care provider. Document Released: 12/14/2010 Document Revised: 09/08/2015 Document Reviewed: 06/09/2014 Elsevier Interactive Patient Education  [employer]2018 Elsevier Inc.

## 2017-10-07 DIAGNOSIS — I10 Essential (primary) hypertension: Secondary | ICD-10-CM | POA: Diagnosis not present

## 2017-10-07 DIAGNOSIS — R945 Abnormal results of liver function studies: Secondary | ICD-10-CM | POA: Diagnosis not present

## 2017-10-07 DIAGNOSIS — E7800 Pure hypercholesterolemia, unspecified: Secondary | ICD-10-CM | POA: Diagnosis not present

## 2017-10-24 ENCOUNTER — Encounter: Payer: Self-pay | Admitting: Gastroenterology

## 2018-01-21 ENCOUNTER — Telehealth: Payer: 59 | Admitting: Physician Assistant

## 2018-01-21 DIAGNOSIS — N390 Urinary tract infection, site not specified: Secondary | ICD-10-CM

## 2018-01-21 MED ORDER — NITROFURANTOIN MONOHYD MACRO 100 MG PO CAPS: 100.0000 mg | ORAL_CAPSULE | Freq: Two times a day (BID) | ORAL | 0 refills | Status: AC

## 2018-01-21 NOTE — Progress Notes (Signed)

## 2018-03-03 ENCOUNTER — Other Ambulatory Visit: Payer: Self-pay

## 2018-03-03 ENCOUNTER — Other Ambulatory Visit
Admission: RE | Admit: 2018-03-03 | Discharge: 2018-03-03 | Disposition: A | Discharge status: Home / Self Care | Payer: 59 | Source: Ambulatory Visit | Attending: Gastroenterology | Admitting: Gastroenterology

## 2018-03-03 ENCOUNTER — Encounter: Payer: Self-pay | Admitting: Gastroenterology

## 2018-03-03 ENCOUNTER — Ambulatory Visit: Payer: 59 | Admitting: Gastroenterology

## 2018-03-03 VITALS — BP 106/71 | HR 82 | Ht 64.0 in | Wt 132.5 lb

## 2018-03-03 DIAGNOSIS — R748 Abnormal levels of other serum enzymes: Secondary | ICD-10-CM | POA: Diagnosis not present

## 2018-03-03 DIAGNOSIS — E785 Hyperlipidemia, unspecified: Secondary | ICD-10-CM | POA: Insufficient documentation

## 2018-03-03 DIAGNOSIS — Z800 Family history of malignant neoplasm of digestive organs: Secondary | ICD-10-CM

## 2018-03-03 DIAGNOSIS — F419 Anxiety disorder, unspecified: Secondary | ICD-10-CM | POA: Insufficient documentation

## 2018-03-03 DIAGNOSIS — F329 Major depressive disorder, single episode, unspecified: Secondary | ICD-10-CM | POA: Insufficient documentation

## 2018-03-03 DIAGNOSIS — F32A Depression, unspecified: Secondary | ICD-10-CM | POA: Insufficient documentation

## 2018-03-03 LAB — HEPATIC FUNCTION PANEL
ALK PHOS: 73 U/L (ref 38–126)
ALT: 60 U/L — AB (ref 0–44)
AST: 45 U/L — ABNORMAL HIGH (ref 15–41)
Albumin: 5 g/dL (ref 3.5–5.0)
BILIRUBIN TOTAL: 0.7 mg/dL (ref 0.3–1.2)
Bilirubin, Direct: <0.1 mg/dL (ref 0.0–0.2)
TOTAL PROTEIN: 8.4 g/dL — AB (ref 6.5–8.1)

## 2018-03-03 LAB — IRON AND TIBC
Iron: 96 ug/dL (ref 28–170)
Saturation Ratios: 23 % (ref 10.4–31.8)
TIBC: 417 ug/dL (ref 250–450)
UIBC: 321 ug/dL

## 2018-03-03 LAB — FERRITIN: Ferritin: 56 ng/mL (ref 11–307)

## 2018-03-03 MED ORDER — NA SULFATE-K SULFATE-MG SULF 17.5-3.13-1.6 GM/177ML PO SOLN: 1.0000 | ORAL | 0 refills | Status: DC

## 2018-03-03 NOTE — Progress Notes (Signed)
Gastroenterology Consultation  Referring Provider:     Leim Fabry, MD Primary Care Physician:  Leim Fabry, MD Primary Gastroenterologist:  Dr. Servando Snare     Reason for Consultation:     Abnormal liver enzymes        HPI:   Barbara Welch is a 52 y.o. y/o female referred for consultation & management of abnormal liver enzymes by Dr. Leim Fabry, MD.  This patient comes in today with a history of abnormal liver enzymes.  The patient's liver enzymes have been elevated as far back as 2016.  The patient's labs briefly returned to normal for a few months during 2018.  Back in 2016 the patient's acute hepatitis A and acute hepatitis B were negative in addition to her hepatitis C antibody.  Her alkaline phosphatase and bilirubin have been normal.  It appears the patient's most recent lab work showed the AST to be 82 with the ALT being 96.  Her labs were in June of this year.  The patient also had an antimitochondrial antibody sent off in 2016 which was normal.  The patient also reports that her mother was diagnosed with colon cancer in January of this year.  The patient is due for colonoscopy for that reason.  The patient also reports that she does drink alcohol and states that she has approximately 3-5 drinks per week.  She states that this is usually mixed drinks.   The patient is a last blood work was in June and at that time she was told to stop her Lipitor.  She has been off it since then.  Past Medical History:  Diagnosis Date  . Hyperlipemia   . Hypertension     Past Surgical History:  Procedure Laterality Date  . ABDOMINAL HYSTERECTOMY    . KNEE ARTHROPLASTY Right     Prior to Admission medications   Medication Sig Start Date End Date Taking? Authorizing Provider  amLODipine (NORVASC) 10 MG tablet Take 10 mg by mouth daily.    [provider]  aspirin EC 81 MG tablet Take 81 mg by mouth daily.    [provider]  atorvastatin (LIPITOR) 10 MG  tablet Take 10 mg by mouth daily.    [provider]  azithromycin (ZITHROMAX Z-PAK) 250 MG tablet Use 2 pills today then 1 pill per day for 4 days Patient not taking: Reported on 10/04/2017 01/24/15   Faythe Ghee, PA-C  benzonatate (TESSALON) 100 MG capsule Take 1-2 capsules (100-200 mg total) by mouth 3 (three) times daily as needed for cough. 10/04/17   Bing Neighbors, FNP  Calcium Carbonate-Vitamin D 600-400 MG-UNIT tablet Take by mouth.    [provider]  calcium citrate-vitamin D (CITRACAL+D) 315-200 MG-UNIT tablet Take 1 tablet by mouth 2 (two) times daily.    [provider]  cephALEXin (KEFLEX) 500 MG capsule Take 1 capsule (500 mg total) by mouth 2 (two) times daily. Patient not taking: Reported on 10/04/2017 11/02/16   Beau Fanny, FNP  cyclobenzaprine (FLEXERIL) 5 MG tablet Take 1 tablet (5 mg total) by mouth 3 (three) times daily as needed for muscle spasms. Do not drive while taking Patient not taking: Reported on 10/04/2017 01/29/16   Renford Dills, NP  hydrochlorothiazide (HYDRODIURIL) 12.5 MG tablet Take 12.5 mg by mouth daily.    [provider]  meloxicam (MOBIC) 7.5 MG tablet Take 7.5 mg by mouth daily.    [provider]  Omega-3 Fatty Acids (FISH OIL CONCENTRATE  PO) Take by mouth.    [provider]  sulfamethoxazole-trimethoprim (BACTRIM DS) 800-160 MG tablet Take 1 tablet by mouth 2 (two) times daily. Patient not taking: Reported on 10/04/2017 11/23/15   Junie SpencerHawks, Christy A, FNP    Family History  Problem Relation Age of Onset  . Breast cancer Maternal Grandmother      Social History   Tobacco Use  . Smoking status: Former Games developermoker  . Smokeless tobacco: Never Used  Substance Use Topics  . Alcohol use: Yes    Alcohol/week: 0.0 standard drinks    Comment: occasional  . Drug use: Not on file    Allergies as of 03/03/2018  . (No Known Allergies)    Review of Systems:    All systems reviewed and negative  except where noted in HPI.   Physical Exam:  There were no vitals taken for this visit. No LMP recorded. Patient has had a hysterectomy. General:   Alert,  Well-developed, ell-nourished, pleasant and cooperative in NAD Head:  Normocephalic and atraumatic. Eyes:  Sclera clear, no icterus.   Conjunctiva pink. Ears:  Normal auditory acuity. Nose:  No deformity, discharge, or lesions. Mouth:  No deformity or lesions,oropharynx pink & moist. Neck:  Supple; no masses or thyromegaly. Lungs:  Respirations even and unlabored.  Clear throughout to auscultation.   No wheezes, crackles, or rhonchi. No acute distress. Heart:  Regular rate and rhythm; no murmurs, clicks, rubs, or gallops. Abdomen:  Normal bowel sounds.  No bruits.  Soft, non-tender and non-distended without masses, hepatosplenomegaly or hernias noted.  No guarding or rebound tenderness.  Negative Carnett sign.   Rectal:  Deferred.  Msk:  Symmetrical without gross deformities.  Good, equal movement & strength bilaterally. Pulses:  Normal pulses noted. Extremities:  No clubbing or edema.  No cyanosis. Neurologic:  Alert and oriented x3;  grossly normal neurologically. Skin:  Intact without significant lesions or rashes.  No jaundice. Lymph Nodes:  No significant cervical adenopathy. Psych:  Alert and cooperative. Normal mood and affect.  Imaging Studies: No results found.  Assessment and Plan:   Barbara Welch is a 52 y.o. y/o female comes in today with a family history of colon cancer in her mother and abnormal liver enzymes.  The patient will be set up for a right upper quadrant ultrasound to rule out any fatty liver.  The patient will also have labs checked to see if she is immune to hepatitis A and B and whether she needs vaccinations for these.  She will also be checked for other possible causes of abnormal liver enzymes.  The patient will be notified with the results and will be notified if her liver enzymes are still elevated.   The patient has been explained the plan and agrees with it.  Midge Miniumarren Stamatia Masri, MD. Clementeen GrahamFACG    Note: This dictation was prepared with Dragon dictation along with smaller phrase technology. Any transcriptional errors that result from this process are unintentional.

## 2018-03-03 NOTE — Patient Instructions (Signed)
You are scheduled for a RUQ abdominal US at [facility] on Thursday, Nov 21st at 9:30am. Please arrive at the medical mall registration desk at 9:15am. You cannot have anything to eat or drink after midnight on Wednesday night.    If you need to reschedule this appointment for any reason, please contact central scheduling at [phone].

## 2018-03-04 LAB — ANA: Anti Nuclear Antibody(ANA): NEGATIVE

## 2018-03-04 LAB — ANTI-SMOOTH MUSCLE ANTIBODY, IGG: F-Actin IgG: 10 Units (ref 0–19)

## 2018-03-04 LAB — HEPATITIS A ANTIBODY, TOTAL: Hep A Total Ab: NEGATIVE

## 2018-03-04 LAB — CERULOPLASMIN: CERULOPLASMIN: 30.5 mg/dL (ref 19.0–39.0)

## 2018-03-04 LAB — HEPATITIS C ANTIBODY

## 2018-03-04 LAB — ALPHA-1-ANTITRYPSIN: A-1 Antitrypsin, Ser: 129 mg/dL (ref 101–187)

## 2018-03-04 LAB — HEPATITIS B SURFACE ANTIBODY,QUALITATIVE: HEP B S AB: REACTIVE

## 2018-03-04 LAB — MITOCHONDRIAL ANTIBODIES

## 2018-03-04 LAB — HEPATITIS B SURFACE ANTIGEN: Hepatitis B Surface Ag: NEGATIVE

## 2018-03-06 ENCOUNTER — Ambulatory Visit
Admission: RE | Admit: 2018-03-06 | Discharge: 2018-03-06 | Disposition: A | Discharge status: Home / Self Care | Payer: 59 | Source: Ambulatory Visit | Attending: Gastroenterology | Admitting: Gastroenterology

## 2018-03-06 DIAGNOSIS — R748 Abnormal levels of other serum enzymes: Secondary | ICD-10-CM | POA: Diagnosis not present

## 2018-03-07 ENCOUNTER — Telehealth: Payer: Self-pay

## 2018-03-07 ENCOUNTER — Other Ambulatory Visit: Payer: Self-pay

## 2018-03-07 DIAGNOSIS — R748 Abnormal levels of other serum enzymes: Secondary | ICD-10-CM

## 2018-03-07 NOTE — Telephone Encounter (Signed)
Pt notified of lab and US results. LFT order has been placed for pt to go in 2 weeks.

## 2018-03-07 NOTE — Telephone Encounter (Signed)
-----   Message from Midge Miniumarren Wohl, MD sent at 03/04/2018  6:07 PM EST ----- Let the patient know that her liver enzymes are still elevated.  The patient is immune to hepatitis B but not to hepatitis A and should receive a vaccination for that.  The blood work did not show any other cause for her abnormal liver enzymes and I suggest that she completely abstain from alcohol for 2 weeks and have the labs checked again.

## 2018-03-07 NOTE — Telephone Encounter (Signed)
-----   Message from Midge Miniumarren Wohl, MD sent at 03/06/2018  1:03 PM EST ----- Let the patient know that the ultrasound showed her to have fatty liver.  This can be caused from alcohol ingestion.

## 2018-04-11 ENCOUNTER — Telehealth: Payer: Self-pay | Admitting: Gastroenterology

## 2018-04-11 ENCOUNTER — Other Ambulatory Visit: Payer: Self-pay

## 2018-04-11 DIAGNOSIS — R748 Abnormal levels of other serum enzymes: Secondary | ICD-10-CM

## 2018-04-11 NOTE — Telephone Encounter (Signed)
Patient left message on the answering service for Barbara Welch. She saw Dr Servando SnareWohl in November and spoke with Barbara Welch and she stated she needed to have a liver panel. She would like to know when she needs to have the liver panel?

## 2018-04-11 NOTE — Telephone Encounter (Signed)
Advised pt she is due for her repeat LFT's now. [facility] to have done.

## 2018-04-11 NOTE — Progress Notes (Signed)
hepa

## 2018-04-24 ENCOUNTER — Other Ambulatory Visit
Admission: RE | Admit: 2018-04-24 | Discharge: 2018-04-24 | Disposition: A | Discharge status: Home / Self Care | Payer: 59 | Attending: Gastroenterology | Admitting: Gastroenterology

## 2018-04-24 ENCOUNTER — Telehealth: Payer: Self-pay

## 2018-04-24 DIAGNOSIS — R748 Abnormal levels of other serum enzymes: Secondary | ICD-10-CM | POA: Insufficient documentation

## 2018-04-24 LAB — HEPATIC FUNCTION PANEL
ALBUMIN: 4.5 g/dL (ref 3.5–5.0)
ALT: 36 U/L (ref 0–44)
AST: 30 U/L (ref 15–41)
Alkaline Phosphatase: 65 U/L (ref 38–126)
Bilirubin, Direct: <0.1 mg/dL (ref 0.0–0.2)
TOTAL PROTEIN: 7.4 g/dL (ref 6.5–8.1)
Total Bilirubin: 0.7 mg/dL (ref 0.3–1.2)

## 2018-04-24 NOTE — Telephone Encounter (Signed)
-----   Message from Midge Minium, MD sent at 04/24/2018 10:55 AM EST ----- Let the patient know the liver enzymes are now normal.

## 2018-04-24 NOTE — Telephone Encounter (Signed)
Sent mychart message with pt's lab results.

## 2018-04-29 ENCOUNTER — Other Ambulatory Visit: Payer: Self-pay

## 2018-04-29 ENCOUNTER — Encounter: Payer: Self-pay | Admitting: *Deleted

## 2018-05-05 ENCOUNTER — Ambulatory Visit: Payer: 59 | Admitting: Anesthesiology

## 2018-05-05 ENCOUNTER — Ambulatory Visit
Admission: RE | Admit: 2018-05-05 | Discharge: 2018-05-05 | Disposition: A | Discharge status: Home / Self Care | Payer: 59 | Attending: Gastroenterology | Admitting: Gastroenterology

## 2018-05-05 ENCOUNTER — Encounter
Admission: RE | Disposition: A | Discharge status: Home / Self Care | Payer: Self-pay | Source: Home / Self Care | Attending: Gastroenterology

## 2018-05-05 DIAGNOSIS — Z87891 Personal history of nicotine dependence: Secondary | ICD-10-CM | POA: Insufficient documentation

## 2018-05-05 DIAGNOSIS — E785 Hyperlipidemia, unspecified: Secondary | ICD-10-CM | POA: Insufficient documentation

## 2018-05-05 DIAGNOSIS — Z800 Family history of malignant neoplasm of digestive organs: Secondary | ICD-10-CM | POA: Diagnosis not present

## 2018-05-05 DIAGNOSIS — I10 Essential (primary) hypertension: Secondary | ICD-10-CM | POA: Diagnosis not present

## 2018-05-05 DIAGNOSIS — Z791 Long term (current) use of non-steroidal anti-inflammatories (NSAID): Secondary | ICD-10-CM | POA: Diagnosis not present

## 2018-05-05 DIAGNOSIS — Z1211 Encounter for screening for malignant neoplasm of colon: Secondary | ICD-10-CM | POA: Diagnosis not present

## 2018-05-05 DIAGNOSIS — Z79899 Other long term (current) drug therapy: Secondary | ICD-10-CM | POA: Diagnosis not present

## 2018-05-05 HISTORY — PX: COLONOSCOPY WITH PROPOFOL: SHX5780

## 2018-05-05 HISTORY — DX: Family history of other specified conditions: Z84.89

## 2018-05-05 SURGERY — COLONOSCOPY WITH PROPOFOL
Anesthesia: General | Site: Rectum

## 2018-05-05 MED ORDER — SODIUM CHLORIDE 0.9 % IV SOLN: INTRAVENOUS | Status: DC

## 2018-05-05 MED ORDER — STERILE WATER FOR IRRIGATION IR SOLN
Status: DC | PRN
  Administered 2018-05-05: 08:00:00

## 2018-05-05 MED ORDER — LIDOCAINE HCL (CARDIAC) PF 100 MG/5ML IV SOSY
PREFILLED_SYRINGE | INTRAVENOUS | Status: DC | PRN
  Administered 2018-05-05: 40 mg via INTRAVENOUS

## 2018-05-05 MED ORDER — LACTATED RINGERS IV SOLN
10.0000 mL/h | INTRAVENOUS | Status: DC
  Administered 2018-05-05: 10 mL/h via INTRAVENOUS

## 2018-05-05 MED ORDER — PROPOFOL 10 MG/ML IV BOLUS
INTRAVENOUS | Status: DC | PRN
  Administered 2018-05-05: 20 mg via INTRAVENOUS
  Administered 2018-05-05: 30 mg via INTRAVENOUS
  Administered 2018-05-05 (×2): 40 mg via INTRAVENOUS
  Administered 2018-05-05 (×2): 30 mg via INTRAVENOUS
  Administered 2018-05-05: 70 mg via INTRAVENOUS
  Administered 2018-05-05: 40 mg via INTRAVENOUS

## 2018-05-05 SURGICAL SUPPLY — 24 items
CANISTER SUCT 1200ML W/VALVE (MISCELLANEOUS) ×2 IMPLANT
CLIP HMST 235XBRD CATH ROT (MISCELLANEOUS) IMPLANT
CLIP RESOLUTION 360 11X235 (MISCELLANEOUS)
ELECT REM PT RETURN 9FT ADLT (ELECTROSURGICAL)
ELECTRODE REM PT RTRN 9FT ADLT (ELECTROSURGICAL) IMPLANT
FCP ESCP3.2XJMB 240X2.8X (MISCELLANEOUS)
FORCEPS BIOP RAD 4 LRG CAP 4 (CUTTING FORCEPS) IMPLANT
FORCEPS BIOP RJ4 240 W/NDL (MISCELLANEOUS)
FORCEPS ESCP3.2XJMB 240X2.8X (MISCELLANEOUS) IMPLANT
GOWN CVR UNV OPN BCK APRN NK (MISCELLANEOUS) ×2 IMPLANT
GOWN ISOL THUMB LOOP REG UNIV (MISCELLANEOUS) ×2
INJECTOR VARIJECT VIN23 (MISCELLANEOUS) IMPLANT
KIT DEFENDO VALVE AND CONN (KITS) IMPLANT
KIT ENDO PROCEDURE OLY (KITS) ×2 IMPLANT
MARKER SPOT ENDO TATTOO 5ML (MISCELLANEOUS) IMPLANT
PROBE APC STR FIRE (PROBE) IMPLANT
RETRIEVER NET ROTH 2.5X230 LF (MISCELLANEOUS) IMPLANT
SNARE SHORT THROW 13M SML OVAL (MISCELLANEOUS) IMPLANT
SNARE SHORT THROW 30M LRG OVAL (MISCELLANEOUS) IMPLANT
SNARE SNG USE RND 15MM (INSTRUMENTS) IMPLANT
SPOT EX ENDOSCOPIC TATTOO (MISCELLANEOUS)
TRAP ETRAP POLY (MISCELLANEOUS) IMPLANT
VARIJECT INJECTOR VIN23 (MISCELLANEOUS)
WATER STERILE IRR 250ML POUR (IV SOLUTION) ×2 IMPLANT

## 2018-05-05 NOTE — Anesthesia Postprocedure Evaluation (Signed)
Anesthesia Post Note  Patient: Barbara Welch  Procedure(s) Performed: COLONOSCOPY WITH PROPOFOL (N/A Rectum)  Patient location during evaluation: PACU Anesthesia Type: General Level of consciousness: awake and alert and oriented Pain management: satisfactory to patient Vital Signs Assessment: post-procedure vital signs reviewed and stable Respiratory status: spontaneous breathing, nonlabored ventilation and respiratory function stable Cardiovascular status: blood pressure returned to baseline and stable Postop Assessment: Adequate PO intake and No signs of nausea or vomiting Anesthetic complications: no    Cherly Beach

## 2018-05-05 NOTE — Anesthesia Preprocedure Evaluation (Signed)
Anesthesia Evaluation  Patient identified by MRN, date of birth, ID band Patient awake    Reviewed: Allergy & Precautions, H&P , NPO status , Patient's Chart, lab work & pertinent test results  Airway Mallampati: II  TM Distance: >3 FB Neck ROM: full    Dental no notable dental hx.    Pulmonary former smoker,    Pulmonary exam normal breath sounds clear to auscultation       Cardiovascular hypertension, Normal cardiovascular exam Rhythm:regular Rate:Normal     Neuro/Psych PSYCHIATRIC DISORDERS    GI/Hepatic   Endo/Other    Renal/GU      Musculoskeletal   Abdominal   Peds  Hematology   Anesthesia Other Findings   Reproductive/Obstetrics                             Anesthesia Physical Anesthesia Plan  ASA: II  Anesthesia Plan: General   Post-op Pain Management:    Induction: Intravenous  PONV Risk Score and Plan: 3 and Propofol infusion and Treatment may vary due to age or medical condition  Airway Management Planned: Natural Airway  Additional Equipment:   Intra-op Plan:   Post-operative Plan:   Informed Consent: I have reviewed the patients History and Physical, chart, labs and discussed the procedure including the risks, benefits and alternatives for the proposed anesthesia with the patient or authorized representative who has indicated his/her understanding and acceptance.       Plan Discussed with: CRNA  Anesthesia Plan Comments:         Anesthesia Quick Evaluation

## 2018-05-05 NOTE — Transfer of Care (Signed)
Immediate Anesthesia Transfer of Care Note  Patient: Barbara BrighamDiana N Welch  Procedure(s) Performed: COLONOSCOPY WITH PROPOFOL (N/A Rectum)  Patient Location: PACU  Anesthesia Type: General  Level of Consciousness: awake, alert  and patient cooperative  Airway and Oxygen Therapy: Patient Spontanous Breathing and Patient connected to supplemental oxygen  Post-op Assessment: Post-op Vital signs reviewed, Patient's Cardiovascular Status Stable, Respiratory Function Stable, Patent Airway and No signs of Nausea or vomiting  Post-op Vital Signs: Reviewed and stable  Complications: No apparent anesthesia complications

## 2018-05-05 NOTE — H&P (Signed)
Barbara Lame, MD [facility] [address], [place], [place] [address] [phone] Fax : [phone]  Primary Care Physician:  Gayland Curry, MD Primary Gastroenterologist:  Dr. Allen Norris  Pre-Procedure History & Physical: HPI:  Barbara Welch is a 53 y.o. female is here for an colonoscopy.   Past Medical History:  Diagnosis Date  . Family history of adverse reaction to anesthesia    Mother - vertigo after anesthesia  . Hyperlipemia   . Hypertension     Past Surgical History:  Procedure Laterality Date  . ABDOMINAL HYSTERECTOMY    . KNEE ARTHROPLASTY Right     Prior to Admission medications   Medication Sig Start Date End Date Taking? Authorizing Provider  amLODipine (NORVASC) 10 MG tablet Take 10 mg by mouth daily.   Yes [provider]  Calcium Carbonate-Vitamin D 600-400 MG-UNIT tablet Take by mouth.   Yes [provider]  COLLAGEN PO Take by mouth.   Yes [provider]  Na Sulfate-K Sulfate-Mg Sulf (SUPREP BOWEL PREP KIT) 17.5-3.13-1.6 GM/177ML SOLN Take 1 kit by mouth as directed. 03/03/18  Yes Barbara Lame, MD  Omega-3 Fatty Acids (FISH OIL CONCENTRATE PO) Take by mouth.   Yes [provider]  hydrochlorothiazide (HYDRODIURIL) 12.5 MG tablet Take 12.5 mg by mouth daily.    [provider]  meloxicam (MOBIC) 7.5 MG tablet Take 7.5 mg by mouth daily.    [provider]    Allergies as of 03/03/2018  . (No Known Allergies)    Family History  Problem Relation Age of Onset  . Breast cancer Maternal Grandmother     Social History   Socioeconomic History  . Marital status: Married    Spouse name: Not on file  . Number of children: Not on file  . Years of education: Not on file  . Highest education level: Not on file  Occupational History  . Not on file  Social Needs  . Financial resource strain: Not on file  . Food insecurity:    Worry: Not on file    Inability: Not on file  . Transportation  needs:    Medical: Not on file    Non-medical: Not on file  Tobacco Use  . Smoking status: Former Smoker    Last attempt to quit: 05/17/2014    Years since quitting: 3.9  . Smokeless tobacco: Never Used  Substance and Sexual Activity  . Alcohol use: Yes    Alcohol/week: 1.0 standard drinks    Types: 1 Glasses of wine per week    Comment: occasional  . Drug use: Never  . Sexual activity: Not on file  Lifestyle  . Physical activity:    Days per week: Not on file    Minutes per session: Not on file  . Stress: Not on file  Relationships  . Social connections:    Talks on phone: Not on file    Gets together: Not on file    Attends religious service: Not on file    Active member of club or organization: Not on file    Attends meetings of clubs or organizations: Not on file    Relationship status: Not on file  . Intimate partner violence:    Fear of current or ex partner: Not on file    Emotionally abused: Not on file    Physically abused: Not on file    Forced sexual activity: Not on file  Other Topics Concern  . Not on file  Social  History Narrative  . Not on file    Review of Systems: See HPI, otherwise negative ROS  Physical Exam: BP 115/78   Pulse 80   Temp 98.8 F (37.1 C) (Temporal)   Resp 16   Ht 5' 4"  (1.626 m)   Wt 59.4 kg   SpO2 100%   BMI 22.49 kg/m  General:   Alert,  pleasant and cooperative in NAD Head:  Normocephalic and atraumatic. Neck:  Supple; no masses or thyromegaly. Lungs:  Clear throughout to auscultation.    Heart:  Regular rate and rhythm. Abdomen:  Soft, nontender and nondistended. Normal bowel sounds, without guarding, and without rebound.   Neurologic:  Alert and  oriented x4;  grossly normal neurologically.  Impression/Plan: SIYONA COTO is here for an colonoscopy to be performed for family history of colon cancer in the patients mother.  Risks, benefits, limitations, and alternatives regarding  colonoscopy have been reviewed  with the patient.  Questions have been answered.  All parties agreeable.   Barbara Lame, MD  05/05/2018, 7:46 AM

## 2018-05-05 NOTE — Op Note (Signed)
[facility] Patient Name: Barbara Welch Procedure Date: 05/05/2018 7:17 AM MRN: [id number] Account #: [id number] Date of Birth: 12/16/1965 Admit Type: Outpatient Age: 53 Room: [facility] OR ROOM 01 Gender: Female Note Status: Finalized Procedure:            Colonoscopy Indications:          Family history of colon cancer in a first-degree                        relative Providers:            Midge Minium MD, MD Referring MD:         Leim Fabry MD, MD (Referring MD) Medicines:            Propofol per Anesthesia Complications:        No immediate complications. Procedure:            Pre-Anesthesia Assessment:                       - Prior to the procedure, a History and Physical was                        performed, and patient medications and allergies were                        reviewed. The patient's tolerance of previous                        anesthesia was also reviewed. The risks and benefits of                        the procedure and the sedation options and risks were                        discussed with the patient. All questions were                        answered, and informed consent was obtained. Prior                        Anticoagulants: The patient has taken no previous                        anticoagulant or antiplatelet agents. ASA Grade                        Assessment: II - A patient with mild systemic disease.                        After reviewing the risks and benefits, the patient was                        deemed in satisfactory condition to undergo the                        procedure.                       After obtaining informed consent, the colonoscope was  passed under direct vision. Throughout the procedure,                        the patient's blood pressure, pulse, and oxygen                        saturations were monitored continuously. The                        Colonoscope was  introduced through the anus and                        advanced to the the cecum, identified by appendiceal                        orifice and ileocecal valve. The colonoscopy was                        performed without difficulty. The patient tolerated the                        procedure well. The quality of the bowel preparation                        was excellent. Findings:      The perianal and digital rectal examinations were normal.      The colon (entire examined portion) appeared normal. Impression:           - The entire examined colon is normal.                       - No specimens collected. Recommendation:       - Discharge patient to home.                       - Resume previous diet.                       - Continue present medications.                       - Repeat colonoscopy in 5 years for surveillance. Procedure Code(s):    --- Professional ---                       [phone], Colonoscopy, flexible; diagnostic, including                        collection of specimen(s) by brushing or washing, when                        performed (separate procedure) Diagnosis Code(s):    --- Professional ---                       Z80.0, Family history of malignant neoplasm of                        digestive organs CPT copyright 2018 American Medical Association. All rights reserved. The codes documented in this report are preliminary and upon coder review may  be revised to meet current compliance requirements. Midge Minium MD, MD 05/05/2018 8:05:06 AM This report  has been signed electronically. Number of Addenda: 0 Note Initiated On: 05/05/2018 7:17 AM Scope Withdrawal Time: 0 hours 6 minutes 48 seconds  Total Procedure Duration: 0 hours 9 minutes 57 seconds       [facility]

## 2018-05-05 NOTE — Anesthesia Procedure Notes (Signed)
Performed by: Keiondre Colee, CRNA Pre-anesthesia Checklist: Patient identified, Emergency Drugs available, Suction available, Timeout performed and Patient being monitored Patient Re-evaluated:Patient Re-evaluated prior to induction Oxygen Delivery Method: Nasal cannula Placement Confirmation: positive ETCO2       

## 2018-05-06 ENCOUNTER — Encounter: Payer: Self-pay | Admitting: Gastroenterology

## 2018-06-16 ENCOUNTER — Other Ambulatory Visit: Payer: Self-pay | Admitting: Family Medicine

## 2018-06-16 DIAGNOSIS — R945 Abnormal results of liver function studies: Secondary | ICD-10-CM | POA: Diagnosis not present

## 2018-06-16 DIAGNOSIS — Z1231 Encounter for screening mammogram for malignant neoplasm of breast: Secondary | ICD-10-CM | POA: Diagnosis not present

## 2018-06-16 DIAGNOSIS — E7800 Pure hypercholesterolemia, unspecified: Secondary | ICD-10-CM | POA: Diagnosis not present

## 2018-06-16 DIAGNOSIS — I10 Essential (primary) hypertension: Secondary | ICD-10-CM | POA: Diagnosis not present

## 2018-06-16 DIAGNOSIS — M8580 Other specified disorders of bone density and structure, unspecified site: Secondary | ICD-10-CM

## 2018-06-17 ENCOUNTER — Other Ambulatory Visit: Payer: Self-pay | Admitting: Family Medicine

## 2018-06-17 DIAGNOSIS — Z1231 Encounter for screening mammogram for malignant neoplasm of breast: Secondary | ICD-10-CM

## 2018-06-26 DIAGNOSIS — H524 Presbyopia: Secondary | ICD-10-CM | POA: Diagnosis not present

## 2018-08-15 MED FILL — AMLODIPINE BESYLATE 10 MG T: 10 | 90 days supply | Qty: 90 | Fill #0

## 2018-12-01 ENCOUNTER — Telehealth: Payer: Self-pay | Admitting: Gastroenterology

## 2018-12-01 DIAGNOSIS — R7989 Other specified abnormal findings of blood chemistry: Secondary | ICD-10-CM | POA: Diagnosis not present

## 2018-12-01 DIAGNOSIS — Z23 Encounter for immunization: Secondary | ICD-10-CM | POA: Diagnosis not present

## 2018-12-01 DIAGNOSIS — E7800 Pure hypercholesterolemia, unspecified: Secondary | ICD-10-CM | POA: Diagnosis not present

## 2018-12-01 DIAGNOSIS — Z0000 Encounter for general adult medical examination without abnormal findings: Secondary | ICD-10-CM | POA: Diagnosis not present

## 2018-12-01 NOTE — Telephone Encounter (Signed)
DR Astrid Divine is asking Barbara Welch to call to see if there is anything else they can do for the patient . Should she order a Fibro scan?

## 2018-12-31 ENCOUNTER — Other Ambulatory Visit: Payer: 59

## 2019-01-14 DIAGNOSIS — M79672 Pain in left foot: Secondary | ICD-10-CM | POA: Diagnosis not present

## 2019-01-14 DIAGNOSIS — M722 Plantar fascial fibromatosis: Secondary | ICD-10-CM | POA: Diagnosis not present

## 2019-01-26 DIAGNOSIS — M722 Plantar fascial fibromatosis: Secondary | ICD-10-CM | POA: Diagnosis not present

## 2019-04-23 ENCOUNTER — Other Ambulatory Visit: Payer: 59

## 2019-04-27 ENCOUNTER — Ambulatory Visit
Admission: RE | Admit: 2019-04-27 | Discharge: 2019-04-27 | Disposition: A | Discharge status: Home / Self Care | Payer: 59 | Source: Ambulatory Visit | Attending: Family Medicine | Admitting: Family Medicine

## 2019-04-27 DIAGNOSIS — M8580 Other specified disorders of bone density and structure, unspecified site: Secondary | ICD-10-CM | POA: Diagnosis not present

## 2019-04-27 DIAGNOSIS — Z1231 Encounter for screening mammogram for malignant neoplasm of breast: Secondary | ICD-10-CM | POA: Insufficient documentation

## 2019-04-27 DIAGNOSIS — M8588 Other specified disorders of bone density and structure, other site: Secondary | ICD-10-CM | POA: Diagnosis not present

## 2019-05-23 ENCOUNTER — Telehealth: Payer: 59 | Admitting: Emergency Medicine

## 2019-05-23 DIAGNOSIS — R300 Dysuria: Secondary | ICD-10-CM

## 2019-05-23 MED ORDER — CEPHALEXIN 500 MG PO CAPS: 500.0000 mg | ORAL_CAPSULE | Freq: Two times a day (BID) | ORAL | 0 refills | Status: DC

## 2019-05-23 NOTE — Progress Notes (Signed)
We are sorry that you are not feeling well.  Here is how we plan to help!  I agree with you. Based on what you shared with me it looks like you most likely have a simple urinary tract infection.  A UTI (Urinary Tract Infection) is a bacterial infection of the bladder.  Most cases of urinary tract infections are simple to treat but a key part of your care is to encourage you to drink plenty of fluids and watch your symptoms carefully.  I have prescribed Keflex 500 mg twice a day for 7 days.  Your symptoms should gradually improve. Call us if the burning in your urine worsens, you develop worsening fever, back pain or pelvic pain or if your symptoms do not resolve after completing the antibiotic.  Urinary tract infections can be prevented by drinking plenty of water to keep your body hydrated.  Also be sure when you wipe, wipe from front to back and don't hold it in!  If possible, empty your bladder every 4 hours.  Your e-visit answers were reviewed by a board certified advanced clinical practitioner to complete your personal care plan.  Depending on the condition, your plan could have included both over the counter or prescription medications.  If there is a problem please reply  once you have received a response from your provider.  Your safety is important to [place].  If you have drug allergies check your prescription carefully.    You can use MyChart to ask questions about today's visit, request a non-urgent call back, or ask for a work or school excuse for 24 hours related to this e-Visit. If it has been greater than 24 hours you will need to follow up with your provider, or enter a new e-Visit to address those concerns.   You will get an e-mail in the next two days asking about your experience.  I hope that your e-visit has been valuable and will speed your recovery. Thank you for using e-visits.  Approximately 5 minutes was used in reviewing the patient's chart, questionnaire, prescribing  medications, and documentation.

## 2019-07-19 ENCOUNTER — Telehealth: Payer: 59 | Admitting: Family

## 2019-07-19 DIAGNOSIS — H109 Unspecified conjunctivitis: Secondary | ICD-10-CM

## 2019-07-19 MED ORDER — POLYMYXIN B-TRIMETHOPRIM 10000-0.1 UNIT/ML-% OP SOLN: 1.0000 [drp] | Freq: Four times a day (QID) | OPHTHALMIC | 0 refills | Status: DC

## 2019-07-19 NOTE — Progress Notes (Signed)
E-Visit for Pink Eye   We are sorry that you are not feeling well.  Here is how we plan to help!  Based on what you have shared with me it looks like you have conjunctivitis.  Conjunctivitis is a common inflammatory or infectious condition of the eye that is often referred to as "pink eye".  In most cases it is contagious (viral or bacterial). However, not all conjunctivitis requires antibiotics (ex. Allergic).  We have made appropriate suggestions for you based upon your presentation.  I have prescribed Polytrim Ophthalmic drops 1-2 drops 4 times a day times 5 days  Pink eye can be highly contagious.  It is typically spread through direct contact with secretions, or contaminated objects or surfaces that one may have touched.  Strict handwashing is suggested with soap and water is urged.  If not available, use alcohol based had sanitizer.  Avoid unnecessary touching of the eye.  If you wear contact lenses, you will need to refrain from wearing them until you see no white discharge from the eye for at least 24 hours after being on medication.  You should see symptom improvement in 1-2 days after starting the medication regimen.  Call us if symptoms are not improved in 1-2 days.  Home Care:  Wash your hands often!  Do not wear your contacts until you complete your treatment plan.  Avoid sharing towels, bed linen, personal items with a person who has pink eye.  See attention for anyone in your home with similar symptoms.  Get Help Right Away If:  Your symptoms do not improve.  You develop blurred or loss of vision.  Your symptoms worsen (increased discharge, pain or redness)  Your e-visit answers were reviewed by a board certified advanced clinical practitioner to complete your personal care plan.  Depending on the condition, your plan could have included both over the counter or prescription medications.  If there is a problem please reply  once you have received a response from your  provider.  Your safety is important to us.  If you have drug allergies check your prescription carefully.    You can use MyChart to ask questions about today's visit, request a non-urgent call back, or ask for a work or school excuse for 24 hours related to this e-Visit. If it has been greater than 24 hours you will need to follow up with your provider, or enter a new e-Visit to address those concerns.   You will get an e-mail in the next two days asking about your experience.  I hope that your e-visit has been valuable and will speed your recovery. Thank you for using e-visits.    Greater than 5 minutes, yet less than 10 minutes of time have been spent researching, coordinating, and implementing care for this patient today.  Thank you for the details you included in the comment boxes. Those details are very helpful in determining the best course of treatment for you and help us to provide the best care.  

## 2019-08-03 ENCOUNTER — Other Ambulatory Visit: Payer: Self-pay | Admitting: Family Medicine

## 2019-08-03 DIAGNOSIS — E7800 Pure hypercholesterolemia, unspecified: Secondary | ICD-10-CM | POA: Diagnosis not present

## 2019-08-03 DIAGNOSIS — R7989 Other specified abnormal findings of blood chemistry: Secondary | ICD-10-CM | POA: Diagnosis not present

## 2019-08-03 DIAGNOSIS — Z800 Family history of malignant neoplasm of digestive organs: Secondary | ICD-10-CM | POA: Diagnosis not present

## 2019-08-03 DIAGNOSIS — F430 Acute stress reaction: Secondary | ICD-10-CM | POA: Diagnosis not present

## 2019-08-06 DIAGNOSIS — H524 Presbyopia: Secondary | ICD-10-CM | POA: Diagnosis not present

## 2019-08-12 ENCOUNTER — Other Ambulatory Visit: Payer: Self-pay | Admitting: Family Medicine

## 2019-09-21 ENCOUNTER — Other Ambulatory Visit: Payer: Self-pay

## 2019-09-21 DIAGNOSIS — F4323 Adjustment disorder with mixed anxiety and depressed mood: Secondary | ICD-10-CM | POA: Diagnosis not present

## 2019-09-21 DIAGNOSIS — E782 Mixed hyperlipidemia: Secondary | ICD-10-CM | POA: Diagnosis not present

## 2019-09-21 DIAGNOSIS — I10 Essential (primary) hypertension: Secondary | ICD-10-CM | POA: Diagnosis not present

## 2020-01-13 DIAGNOSIS — E782 Mixed hyperlipidemia: Secondary | ICD-10-CM | POA: Diagnosis not present

## 2020-02-10 ENCOUNTER — Other Ambulatory Visit: Payer: Self-pay | Admitting: Family Medicine

## 2020-02-22 ENCOUNTER — Telehealth: Payer: 59 | Admitting: Emergency Medicine

## 2020-02-22 ENCOUNTER — Other Ambulatory Visit: Payer: Self-pay | Admitting: Emergency Medicine

## 2020-02-22 DIAGNOSIS — J029 Acute pharyngitis, unspecified: Secondary | ICD-10-CM

## 2020-02-22 MED ORDER — AMOXICILLIN 500 MG PO CAPS: 500.0000 mg | ORAL_CAPSULE | Freq: Two times a day (BID) | ORAL | 0 refills | Status: DC

## 2020-02-22 NOTE — Progress Notes (Signed)
We are sorry that you are not feeling well.  Here is how we plan to help!  Based on what you have shared with me it is likely that you have strep pharyngitis.  Strep pharyngitis is inflammation and infection in the back of the throat.  This is an infection cause by bacteria and is treated with antibiotics.  I have prescribed Amoxicillin 500 mg twice a day for 10 days. For throat pain, we recommend over the counter oral pain relief medications such as acetaminophen or aspirin, or anti-inflammatory medications such as ibuprofen or naproxen sodium. Topical treatments such as oral throat lozenges or sprays may be used as needed. Strep infections are not as easily transmitted as other respiratory infections, however we still recommend that you avoid close contact with loved ones, especially the very young and elderly.  Remember to wash your hands thoroughly throughout the day as this is the number one way to prevent the spread of infection and wipe down door knobs and counters with disinfectant.   Home Care:  Only take medications as instructed by your medical team.  Complete the entire course of an antibiotic.  Do not take these medications with alcohol.  A steam or ultrasonic humidifier can help congestion.  You can place a towel over your head and breathe in the steam from hot water coming from a faucet.  Avoid close contacts especially the very young and the elderly.  Cover your mouth when you cough or sneeze.  Always remember to wash your hands.  Get Help Right Away If:  You develop worsening fever or sinus pain.  You develop a severe head ache or visual changes.  Your symptoms persist after you have completed your treatment plan.  Make sure you  Understand these instructions.  Will watch your condition.  Will get help right away if you are not doing well or get worse.  Your e-visit answers were reviewed by a board certified advanced clinical practitioner to complete your  personal care plan.  Depending on the condition, your plan could have included both over the counter or prescription medications.  If there is a problem please reply  once you have received a response from your provider.  Your safety is important to [place].  If you have drug allergies check your prescription carefully.    You can use MyChart to ask questions about today's visit, request a non-urgent call back, or ask for a work or school excuse for 24 hours related to this e-Visit. If it has been greater than 24 hours you will need to follow up with your provider, or enter a new e-Visit to address those concerns.  You will get an e-mail in the next two days asking about your experience.  I hope that your e-visit has been valuable and will speed your recovery. Thank you for using e-visits.  I spent approximately 5-10 minutes reviewing the patient's chart and medical information.

## 2020-05-08 ENCOUNTER — Telehealth: Payer: 59 | Admitting: Orthopedic Surgery

## 2020-05-08 DIAGNOSIS — R059 Cough, unspecified: Secondary | ICD-10-CM | POA: Diagnosis not present

## 2020-05-08 MED ORDER — AZITHROMYCIN 250 MG PO TABS: ORAL_TABLET | ORAL | 0 refills | Status: DC

## 2020-05-08 MED ORDER — BENZONATATE 100 MG PO CAPS: 100.0000 mg | ORAL_CAPSULE | Freq: Three times a day (TID) | ORAL | 0 refills | Status: DC | PRN

## 2020-05-08 NOTE — Progress Notes (Signed)
We are sorry that you are not feeling well.  Here is how we plan to help!  Based on your presentation I believe you most likely have A cough due to bacteria.  When patients have a fever and a productive cough with a change in color or increased sputum production, we are concerned about bacterial bronchitis.  If left untreated it can progress to pneumonia.  If your symptoms do not improve with your treatment plan it is important that you contact your provider.   I have prescribed Azithromyin 250 mg: two tablets now and then one tablet daily for 4 additonal days    In addition you may use A prescription cough medication called Tessalon Perles 100mg. You may take 1-2 capsules every 8 hours as needed for your cough.   From your responses in the eVisit questionnaire you describe inflammation in the upper respiratory tract which is causing a significant cough.  This is commonly called Bronchitis and has four common causes:    Allergies  Viral Infections  Acid Reflux  Bacterial Infection Allergies, viruses and acid reflux are treated by controlling symptoms or eliminating the cause. An example might be a cough caused by taking certain blood pressure medications. You stop the cough by changing the medication. Another example might be a cough caused by acid reflux. Controlling the reflux helps control the cough.  USE OF BRONCHODILATOR ("RESCUE") INHALERS: There is a risk from using your bronchodilator too frequently.  The risk is that over-reliance on a medication which only relaxes the muscles surrounding the breathing tubes can reduce the effectiveness of medications prescribed to reduce swelling and congestion of the tubes themselves.  Although you feel brief relief from the bronchodilator inhaler, your asthma may actually be worsening with the tubes becoming more swollen and filled with mucus.  This can delay other crucial treatments, such as oral steroid medications. If you need to use a  bronchodilator inhaler daily, several times per day, you should discuss this with your provider.  There are probably better treatments that could be used to keep your asthma under control.     HOME CARE . Only take medications as instructed by your medical team. . Complete the entire course of an antibiotic. . Drink plenty of fluids and get plenty of rest. . Avoid close contacts especially the very young and the elderly . Cover your mouth if you cough or cough into your sleeve. . Always remember to wash your hands . A steam or ultrasonic humidifier can help congestion.   GET HELP RIGHT AWAY IF: . You develop worsening fever. . You become short of breath . You cough up blood. . Your symptoms persist after you have completed your treatment plan MAKE SURE YOU   Understand these instructions.  Will watch your condition.  Will get help right away if you are not doing well or get worse.  Your e-visit answers were reviewed by a board certified advanced clinical practitioner to complete your personal care plan.  Depending on the condition, your plan could have included both over the counter or prescription medications. If there is a problem please reply  once you have received a response from your provider. Your safety is important to us.  If you have drug allergies check your prescription carefully.    You can use MyChart to ask questions about today's visit, request a non-urgent call back, or ask for a work or school excuse for 24 hours related to this e-Visit. If it has been   greater than 24 hours you will need to follow up with your provider, or enter a new e-Visit to address those concerns. You will get an e-mail in the next two days asking about your experience.  I hope that your e-visit has been valuable and will speed your recovery. Thank you for using e-visits.  Greater than 5 minutes, yet less than 10 minutes of time have been spent researching, coordinating and implementing care for  this patient today.   

## 2020-05-25 ENCOUNTER — Telehealth: Payer: 59 | Admitting: Nurse Practitioner

## 2020-05-25 DIAGNOSIS — J029 Acute pharyngitis, unspecified: Secondary | ICD-10-CM | POA: Diagnosis not present

## 2020-05-25 NOTE — Progress Notes (Signed)

## 2020-05-27 ENCOUNTER — Other Ambulatory Visit: Payer: Self-pay

## 2020-05-27 ENCOUNTER — Ambulatory Visit
Admission: RE | Admit: 2020-05-27 | Discharge: 2020-05-27 | Disposition: A | Discharge status: Home / Self Care | Payer: 59 | Source: Ambulatory Visit | Attending: Family Medicine | Admitting: Family Medicine

## 2020-05-27 ENCOUNTER — Other Ambulatory Visit: Payer: Self-pay | Admitting: Family Medicine

## 2020-05-27 VITALS — BP 129/86 | HR 88 | Temp 99.4°F | Resp 18

## 2020-05-27 DIAGNOSIS — J0110 Acute frontal sinusitis, unspecified: Secondary | ICD-10-CM

## 2020-05-27 DIAGNOSIS — J029 Acute pharyngitis, unspecified: Secondary | ICD-10-CM | POA: Diagnosis not present

## 2020-05-27 MED ORDER — AMOXICILLIN-POT CLAVULANATE 875-125 MG PO TABS: 1.0000 | ORAL_TABLET | Freq: Two times a day (BID) | ORAL | 0 refills | Status: DC

## 2020-05-27 NOTE — ED Triage Notes (Signed)
Pt presents today with cough x 3 weeks and sore throat x 4 days. Denies fever. Had negative Covid test end of 1/22.

## 2020-05-27 NOTE — Discharge Instructions (Addendum)
Take the antibiotics as prescribed. Continue to warm salt water gargles.  Ibuprofen for pain as needed.  He can also do Tylenol Follow up as needed for continued or worsening symptoms

## 2020-05-29 NOTE — ED Provider Notes (Signed)
Barbara Welch    CSN: [id number] Arrival date & time: 05/27/20  1154      History   Chief Complaint Chief Complaint  Patient presents with  . Sore Throat  . Cough    HPI Barbara Welch is a 55 y.o. female.   Patient is a 55 year old female presents today with complaints of nasal ingestion, cough, sore throat.  This is been off and on for the past 3 weeks and worsening.  Sore throat x4 days.  Denies any fever.  Negative Covid testing 1/22.  history of sinus infections     Past Medical History:  Diagnosis Date  . Family history of adverse reaction to anesthesia    Mother - vertigo after anesthesia  . Hyperlipemia   . Hypertension     Patient Active Problem List   Diagnosis Date Noted  . Family history of colon cancer   . Anxiety 03/03/2018  . Depression 03/03/2018  . Hyperlipidemia 03/03/2018  . History of kidney stones 04/01/2017  . Osteopenia of spine 04/01/2017  . LFT elevation 08/09/2015  . Essential hypertension with goal blood pressure less than 140/90 08/04/2014    Past Surgical History:  Procedure Laterality Date  . ABDOMINAL HYSTERECTOMY    . COLONOSCOPY WITH PROPOFOL N/A 05/05/2018   Procedure: COLONOSCOPY WITH PROPOFOL;  Surgeon: Midge Minium, MD;  Location: [facility];  Service: Endoscopy;  Laterality: N/A;  . KNEE ARTHROPLASTY Right     OB History   No obstetric history on file.      Home Medications    Prior to Admission medications   Medication Sig Start Date End Date Taking? Authorizing Provider  amLODipine (NORVASC) 10 MG tablet Take 10 mg by mouth daily.   Yes [provider]  amoxicillin-clavulanate (AUGMENTIN) 875-125 MG tablet Take 1 tablet by mouth every 12 (twelve) hours. 05/27/20  Yes Welborn Keena A, NP  Calcium Carbonate-Vitamin D 600-400 MG-UNIT tablet Take by mouth.   Yes [provider]  COLLAGEN PO Take by mouth.   Yes [provider]  Omega-3 Fatty Acids (FISH OIL CONCENTRATE PO)  Take by mouth.   Yes [provider]  hydrochlorothiazide (HYDRODIURIL) 12.5 MG tablet Take 12.5 mg by mouth daily.  05/27/20  [provider]    Family History Family History  Problem Relation Age of Onset  . Breast cancer Maternal Grandmother     Social History Social History   Tobacco Use  . Smoking status: Former Smoker    Quit date: 05/17/2014    Years since quitting: 6.0  . Smokeless tobacco: Never Used  Vaping Use  . Vaping Use: Never used  Substance Use Topics  . Alcohol use: Yes    Alcohol/week: 1.0 standard drink    Types: 1 Glasses of wine per week    Comment: occasional  . Drug use: Never     Allergies   Patient has no known allergies.   Review of Systems Review of Systems   Physical Exam Triage Vital Signs ED Triage Vitals [05/27/20 1214]  Enc Vitals Group     BP 129/86     Pulse Rate 88     Resp 18     Temp 99.4 F (37.4 C)     Temp Source Oral     SpO2 94 %     Weight      Height      Head Circumference      Peak Flow      Pain Score  Pain Loc      Pain Edu?      Excl. in GC?    No data found.  Updated Vital Signs BP 129/86 (BP Location: Left Arm)   Pulse 88   Temp 99.4 F (37.4 C) (Oral)   Resp 18   SpO2 94%   Visual Acuity Right Eye Distance:   Left Eye Distance:   Bilateral Distance:    Right Eye Near:   Left Eye Near:    Bilateral Near:     Physical Exam Vitals and nursing note reviewed.  Constitutional:      General: She is not in acute distress.    Appearance: Normal appearance. She is not ill-appearing, toxic-appearing or diaphoretic.  HENT:     Head: Normocephalic.     Right Ear: Tympanic membrane and ear canal normal.     Left Ear: Tympanic membrane and ear canal normal.     Nose: Congestion present.     Mouth/Throat:     Pharynx: Oropharynx is clear.  Eyes:     Conjunctiva/sclera: Conjunctivae normal.  Cardiovascular:     Rate and Rhythm: Normal rate and regular rhythm.  Pulmonary:      Effort: Pulmonary effort is normal.     Breath sounds: Normal breath sounds.  Musculoskeletal:        General: Normal range of motion.     Cervical back: Normal range of motion.  Skin:    General: Skin is warm and dry.     Findings: No rash.  Neurological:     Mental Status: She is alert.  Psychiatric:        Mood and Affect: Mood normal.      UC Treatments / Results  Labs (all labs ordered are listed, but only abnormal results are displayed) Labs Reviewed - No data to display  EKG   Radiology No results found.  Procedures Procedures (including critical care time)  Medications Ordered in UC Medications - No data to display  Initial Impression / Assessment and Plan / UC Course  I have reviewed the triage vital signs and the nursing notes.  Pertinent labs & imaging results that were available during my care of the patient were reviewed by me and considered in my medical decision making (see chart for details).     Sinusitis  Symptoms worsening over the past 3 weeks. We will treat for bacterial sinus infection at this time.  Treating with amoxicillin. Recommended continue Tylenol or ibuprofen for pain as needed.  Warm salt water gargles to the throat. Follow up as needed for continued or worsening symptoms  Final Clinical Impressions(s) / UC Diagnoses   Final diagnoses:  Sore throat  Acute non-recurrent frontal sinusitis     Discharge Instructions     Take the antibiotics as prescribed. Continue to warm salt water gargles.  Ibuprofen for pain as needed.  He can also do Tylenol Follow up as needed for continued or worsening symptoms     ED Prescriptions    Medication Sig Dispense Auth. Provider   amoxicillin-clavulanate (AUGMENTIN) 875-125 MG tablet Take 1 tablet by mouth every 12 (twelve) hours. 14 tablet Saffron Busey A, NP     PDMP not reviewed this encounter.   Janace Aris, NP 05/29/20 1217

## 2020-05-31 DIAGNOSIS — Z0000 Encounter for general adult medical examination without abnormal findings: Secondary | ICD-10-CM | POA: Diagnosis not present

## 2020-05-31 DIAGNOSIS — R7989 Other specified abnormal findings of blood chemistry: Secondary | ICD-10-CM | POA: Diagnosis not present

## 2020-05-31 DIAGNOSIS — E7800 Pure hypercholesterolemia, unspecified: Secondary | ICD-10-CM | POA: Diagnosis not present

## 2020-05-31 DIAGNOSIS — Z130 Encounter for screening for diseases of the blood and blood-forming organs and certain disorders involving the immune mechanism: Secondary | ICD-10-CM | POA: Diagnosis not present

## 2020-06-02 ENCOUNTER — Other Ambulatory Visit: Payer: Self-pay | Admitting: Family Medicine

## 2020-06-02 DIAGNOSIS — Z1231 Encounter for screening mammogram for malignant neoplasm of breast: Secondary | ICD-10-CM

## 2020-06-06 ENCOUNTER — Ambulatory Visit
Admission: RE | Admit: 2020-06-06 | Discharge: 2020-06-06 | Disposition: A | Discharge status: Home / Self Care | Payer: 59 | Source: Ambulatory Visit | Attending: Family Medicine | Admitting: Family Medicine

## 2020-06-06 ENCOUNTER — Other Ambulatory Visit: Payer: Self-pay

## 2020-06-06 DIAGNOSIS — Z1231 Encounter for screening mammogram for malignant neoplasm of breast: Secondary | ICD-10-CM | POA: Diagnosis not present

## 2020-07-20 DIAGNOSIS — H5203 Hypermetropia, bilateral: Secondary | ICD-10-CM | POA: Diagnosis not present

## 2020-08-07 ENCOUNTER — Other Ambulatory Visit: Payer: Self-pay

## 2020-08-07 MED FILL — Amlodipine Besylate Tab 10 MG (Base Equivalent): ORAL | 90 days supply | Qty: 90 | Fill #0 | Status: AC

## 2020-08-08 ENCOUNTER — Other Ambulatory Visit: Payer: Self-pay

## 2020-08-11 ENCOUNTER — Other Ambulatory Visit: Payer: Self-pay

## 2020-08-11 MED FILL — Sulfamethoxazole-Trimethoprim Tab 400-80 MG: ORAL | 20 days supply | Qty: 20 | Fill #0 | Status: AC

## 2020-09-11 ENCOUNTER — Other Ambulatory Visit: Payer: Self-pay

## 2020-09-13 ENCOUNTER — Other Ambulatory Visit: Payer: Self-pay

## 2020-09-14 ENCOUNTER — Other Ambulatory Visit: Payer: Self-pay

## 2020-09-15 ENCOUNTER — Other Ambulatory Visit: Payer: Self-pay

## 2020-09-16 ENCOUNTER — Other Ambulatory Visit: Payer: Self-pay

## 2020-09-16 MED ORDER — EZETIMIBE 10 MG PO TABS
ORAL_TABLET | ORAL | 3 refills | Status: DC
  Filled 2020-09-16: qty 90, 90d supply, fill #0
  Filled 2020-12-21: qty 90, 90d supply, fill #1
  Filled 2021-04-02: qty 90, 90d supply, fill #2
  Filled 2021-07-16: qty 90, 90d supply, fill #3

## 2020-09-16 MED ORDER — ROSUVASTATIN CALCIUM 10 MG PO TABS
10.0000 mg | ORAL_TABLET | Freq: Every day | ORAL | 3 refills | Status: DC
  Filled 2020-09-16: qty 90, 90d supply, fill #0
  Filled 2020-12-21: qty 90, 90d supply, fill #1
  Filled 2021-04-02: qty 90, 90d supply, fill #2
  Filled 2021-07-16: qty 90, 90d supply, fill #3

## 2020-09-19 ENCOUNTER — Other Ambulatory Visit: Payer: Self-pay

## 2020-10-30 MED FILL — Amlodipine Besylate Tab 10 MG (Base Equivalent): ORAL | 90 days supply | Qty: 90 | Fill #1 | Status: AC

## 2020-10-31 ENCOUNTER — Other Ambulatory Visit: Payer: Self-pay

## 2020-12-22 ENCOUNTER — Other Ambulatory Visit: Payer: Self-pay

## 2021-01-29 ENCOUNTER — Other Ambulatory Visit: Payer: Self-pay

## 2021-01-30 ENCOUNTER — Other Ambulatory Visit: Payer: Self-pay

## 2021-01-31 ENCOUNTER — Other Ambulatory Visit: Payer: Self-pay

## 2021-01-31 MED ORDER — AMLODIPINE BESYLATE 10 MG PO TABS
10.0000 mg | ORAL_TABLET | Freq: Every day | ORAL | 0 refills | Status: DC
  Filled 2021-01-31: qty 90, 90d supply, fill #0

## 2021-02-06 ENCOUNTER — Other Ambulatory Visit: Payer: Self-pay

## 2021-02-06 DIAGNOSIS — E7800 Pure hypercholesterolemia, unspecified: Secondary | ICD-10-CM | POA: Diagnosis not present

## 2021-02-06 DIAGNOSIS — I10 Essential (primary) hypertension: Secondary | ICD-10-CM | POA: Diagnosis not present

## 2021-02-06 DIAGNOSIS — Z130 Encounter for screening for diseases of the blood and blood-forming organs and certain disorders involving the immune mechanism: Secondary | ICD-10-CM | POA: Diagnosis not present

## 2021-02-06 DIAGNOSIS — R7989 Other specified abnormal findings of blood chemistry: Secondary | ICD-10-CM | POA: Diagnosis not present

## 2021-02-06 MED ORDER — EZETIMIBE 10 MG PO TABS
10.0000 mg | ORAL_TABLET | Freq: Every day | ORAL | 3 refills | Status: DC
  Filled 2021-02-06: qty 90, 90d supply, fill #0

## 2021-02-14 ENCOUNTER — Other Ambulatory Visit: Payer: Self-pay

## 2021-02-14 DIAGNOSIS — R7989 Other specified abnormal findings of blood chemistry: Secondary | ICD-10-CM

## 2021-03-01 ENCOUNTER — Other Ambulatory Visit: Payer: Self-pay

## 2021-03-01 ENCOUNTER — Telehealth: Payer: 59 | Admitting: Nurse Practitioner

## 2021-03-01 DIAGNOSIS — R399 Unspecified symptoms and signs involving the genitourinary system: Secondary | ICD-10-CM

## 2021-03-01 MED ORDER — CEPHALEXIN 500 MG PO CAPS
500.0000 mg | ORAL_CAPSULE | Freq: Two times a day (BID) | ORAL | 0 refills | Status: DC
  Filled 2021-03-01: qty 14, 7d supply, fill #0

## 2021-03-01 NOTE — Progress Notes (Signed)

## 2021-04-02 ENCOUNTER — Other Ambulatory Visit: Payer: Self-pay

## 2021-04-03 ENCOUNTER — Other Ambulatory Visit: Payer: Self-pay

## 2021-05-06 ENCOUNTER — Other Ambulatory Visit: Payer: Self-pay

## 2021-05-08 ENCOUNTER — Other Ambulatory Visit: Payer: Self-pay

## 2021-05-08 MED ORDER — AMLODIPINE BESYLATE 10 MG PO TABS
10.0000 mg | ORAL_TABLET | Freq: Every day | ORAL | 0 refills | Status: DC
  Filled 2021-05-08: qty 90, 90d supply, fill #0

## 2021-05-09 ENCOUNTER — Other Ambulatory Visit: Payer: Self-pay

## 2021-05-09 MED ORDER — SULFAMETHOXAZOLE-TRIMETHOPRIM 400-80 MG PO TABS
ORAL_TABLET | ORAL | 0 refills | Status: DC
  Filled 2021-05-09: qty 20, 20d supply, fill #0

## 2021-06-28 ENCOUNTER — Other Ambulatory Visit: Payer: Self-pay | Admitting: Family Medicine

## 2021-06-28 DIAGNOSIS — Z1231 Encounter for screening mammogram for malignant neoplasm of breast: Secondary | ICD-10-CM

## 2021-06-30 ENCOUNTER — Other Ambulatory Visit: Payer: Self-pay

## 2021-06-30 ENCOUNTER — Ambulatory Visit
Admission: RE | Admit: 2021-06-30 | Discharge: 2021-06-30 | Disposition: A | Discharge status: Home / Self Care | Payer: 59 | Source: Ambulatory Visit | Attending: Family Medicine | Admitting: Family Medicine

## 2021-06-30 DIAGNOSIS — Z1231 Encounter for screening mammogram for malignant neoplasm of breast: Secondary | ICD-10-CM | POA: Insufficient documentation

## 2021-07-16 ENCOUNTER — Other Ambulatory Visit: Payer: Self-pay

## 2021-07-17 ENCOUNTER — Other Ambulatory Visit: Payer: Self-pay

## 2021-07-17 MED ORDER — AMLODIPINE BESYLATE 10 MG PO TABS
10.0000 mg | ORAL_TABLET | Freq: Every day | ORAL | 0 refills | Status: DC
  Filled 2021-07-17: qty 90, 90d supply, fill #0

## 2021-07-19 ENCOUNTER — Other Ambulatory Visit: Payer: Self-pay

## 2021-08-28 ENCOUNTER — Other Ambulatory Visit: Payer: Self-pay

## 2021-08-28 DIAGNOSIS — Z1331 Encounter for screening for depression: Secondary | ICD-10-CM | POA: Diagnosis not present

## 2021-08-28 DIAGNOSIS — R7989 Other specified abnormal findings of blood chemistry: Secondary | ICD-10-CM | POA: Diagnosis not present

## 2021-08-28 DIAGNOSIS — M25562 Pain in left knee: Secondary | ICD-10-CM | POA: Diagnosis not present

## 2021-08-28 DIAGNOSIS — E7800 Pure hypercholesterolemia, unspecified: Secondary | ICD-10-CM | POA: Diagnosis not present

## 2021-08-28 DIAGNOSIS — G8929 Other chronic pain: Secondary | ICD-10-CM | POA: Diagnosis not present

## 2021-08-28 DIAGNOSIS — Z0000 Encounter for general adult medical examination without abnormal findings: Secondary | ICD-10-CM | POA: Diagnosis not present

## 2021-08-28 DIAGNOSIS — I10 Essential (primary) hypertension: Secondary | ICD-10-CM | POA: Diagnosis not present

## 2021-08-28 MED ORDER — ROSUVASTATIN CALCIUM 10 MG PO TABS
10.0000 mg | ORAL_TABLET | Freq: Every day | ORAL | 3 refills | Status: DC
  Filled 2021-08-28 – 2021-11-26 (×2): qty 90, 90d supply, fill #0
  Filled 2022-06-13: qty 90, 90d supply, fill #1

## 2021-08-28 MED ORDER — AMLODIPINE BESYLATE 10 MG PO TABS
10.0000 mg | ORAL_TABLET | Freq: Every day | ORAL | 3 refills | Status: DC
  Filled 2021-08-28 – 2021-10-22 (×2): qty 90, 90d supply, fill #0
  Filled 2022-01-21: qty 90, 90d supply, fill #1
  Filled 2022-04-22: qty 90, 90d supply, fill #2
  Filled 2022-07-08: qty 90, 90d supply, fill #3

## 2021-08-28 MED ORDER — EZETIMIBE 10 MG PO TABS
10.0000 mg | ORAL_TABLET | Freq: Every day | ORAL | 3 refills | Status: DC
  Filled 2021-08-28 – 2021-10-12 (×2): qty 90, 90d supply, fill #0
  Filled 2022-01-08: qty 90, 90d supply, fill #1
  Filled 2022-04-11: qty 90, 90d supply, fill #2
  Filled 2022-07-08: qty 90, 90d supply, fill #3

## 2021-10-09 DIAGNOSIS — M1712 Unilateral primary osteoarthritis, left knee: Secondary | ICD-10-CM | POA: Diagnosis not present

## 2021-10-11 ENCOUNTER — Other Ambulatory Visit: Payer: Self-pay

## 2021-10-12 ENCOUNTER — Other Ambulatory Visit: Payer: Self-pay

## 2021-10-22 ENCOUNTER — Other Ambulatory Visit: Payer: Self-pay

## 2021-10-23 ENCOUNTER — Other Ambulatory Visit: Payer: Self-pay

## 2021-11-23 DIAGNOSIS — H5203 Hypermetropia, bilateral: Secondary | ICD-10-CM | POA: Diagnosis not present

## 2021-11-26 ENCOUNTER — Other Ambulatory Visit: Payer: Self-pay

## 2021-11-27 ENCOUNTER — Other Ambulatory Visit: Payer: Self-pay

## 2022-01-08 ENCOUNTER — Other Ambulatory Visit: Payer: Self-pay

## 2022-01-16 DIAGNOSIS — M1712 Unilateral primary osteoarthritis, left knee: Secondary | ICD-10-CM | POA: Diagnosis not present

## 2022-01-17 ENCOUNTER — Other Ambulatory Visit: Payer: Self-pay | Admitting: Orthopedic Surgery

## 2022-01-17 ENCOUNTER — Ambulatory Visit
Admission: RE | Admit: 2022-01-17 | Discharge: 2022-01-17 | Disposition: A | Discharge status: Home / Self Care | Payer: 59 | Source: Ambulatory Visit | Attending: Orthopedic Surgery | Admitting: Orthopedic Surgery

## 2022-01-17 DIAGNOSIS — M1712 Unilateral primary osteoarthritis, left knee: Secondary | ICD-10-CM | POA: Insufficient documentation

## 2022-01-17 DIAGNOSIS — M25562 Pain in left knee: Secondary | ICD-10-CM | POA: Diagnosis not present

## 2022-01-17 DIAGNOSIS — R600 Localized edema: Secondary | ICD-10-CM | POA: Diagnosis not present

## 2022-01-17 DIAGNOSIS — M25462 Effusion, left knee: Secondary | ICD-10-CM | POA: Diagnosis not present

## 2022-01-22 ENCOUNTER — Other Ambulatory Visit: Payer: Self-pay

## 2022-01-29 DIAGNOSIS — M1732 Unilateral post-traumatic osteoarthritis, left knee: Secondary | ICD-10-CM | POA: Diagnosis not present

## 2022-02-09 ENCOUNTER — Telehealth: Payer: 59 | Admitting: Physician Assistant

## 2022-02-09 ENCOUNTER — Other Ambulatory Visit: Payer: Self-pay

## 2022-02-09 DIAGNOSIS — J208 Acute bronchitis due to other specified organisms: Secondary | ICD-10-CM

## 2022-02-09 MED ORDER — BENZONATATE 100 MG PO CAPS
100.0000 mg | ORAL_CAPSULE | Freq: Three times a day (TID) | ORAL | 0 refills | Status: DC | PRN
  Filled 2022-02-09: qty 30, 10d supply, fill #0

## 2022-02-09 MED ORDER — PREDNISONE 20 MG PO TABS
40.0000 mg | ORAL_TABLET | Freq: Every day | ORAL | 0 refills | Status: DC
Start: 2022-02-09 — End: 2022-06-26
  Filled 2022-02-09: qty 10, 5d supply, fill #0

## 2022-02-09 NOTE — Progress Notes (Signed)
I have spent 5 minutes in review of e-visit questionnaire, review and updating patient chart, medical decision making and response to patient.   Hasna Stefanik Cody Hisayo Delossantos, PA-C    

## 2022-02-09 NOTE — Progress Notes (Signed)
We are sorry that you are not feeling well.  Here is how we plan to help!  Based on your presentation I believe you most likely have A cough due to a virus.  This is called viral bronchitis and is best treated by rest, plenty of fluids and control of the cough.  You may use Ibuprofen or Tylenol as directed to help your symptoms.     In addition you may use A prescription cough medication called Tessalon Perles 100mg . You may take 1-2 capsules every 8 hours as needed for your cough.  I have also given a short course of steroid to calm down inflammation in upper airways and ease bronchospasm.  From your responses in the eVisit questionnaire you describe inflammation in the upper respiratory tract which is causing a significant cough.  This is commonly called Bronchitis and has four common causes:   Allergies Viral Infections Acid Reflux Bacterial Infection Allergies, viruses and acid reflux are treated by controlling symptoms or eliminating the cause. An example might be a cough caused by taking certain blood pressure medications. You stop the cough by changing the medication. Another example might be a cough caused by acid reflux. Controlling the reflux helps control the cough.  USE OF BRONCHODILATOR ("RESCUE") INHALERS: There is a risk from using your bronchodilator too frequently.  The risk is that over-reliance on a medication which only relaxes the muscles surrounding the breathing tubes can reduce the effectiveness of medications prescribed to reduce swelling and congestion of the tubes themselves.  Although you feel brief relief from the bronchodilator inhaler, your asthma may actually be worsening with the tubes becoming more swollen and filled with mucus.  This can delay other crucial treatments, such as oral steroid medications. If you need to use a bronchodilator inhaler daily, several times per day, you should discuss this with your provider.  There are probably better treatments that could  be used to keep your asthma under control.     HOME CARE Only take medications as instructed by your medical team. Complete the entire course of an antibiotic. Drink plenty of fluids and get plenty of rest. Avoid close contacts especially the very young and the elderly Cover your mouth if you cough or cough into your sleeve. Always remember to wash your hands A steam or ultrasonic humidifier can help congestion.   GET HELP RIGHT AWAY IF: You develop worsening fever. You become short of breath You cough up blood. Your symptoms persist after you have completed your treatment plan MAKE SURE YOU  Understand these instructions. Will watch your condition. Will get help right away if you are not doing well or get worse.    Thank you for choosing an e-visit.  Your e-visit answers were reviewed by a board certified advanced clinical practitioner to complete your personal care plan. Depending upon the condition, your plan could have included both over the counter or prescription medications.  Please review your pharmacy choice. Make sure the pharmacy is open so you can pick up prescription now. If there is a problem, you may contact your provider through [employer] and have the prescription routed to another pharmacy.  Your safety is important to [place]. If you have drug allergies check your prescription carefully.   For the next 24 hours you can use MyChart to ask questions about today's visit, request a non-urgent call back, or ask for a work or school excuse. You will get an email in the next two days asking about your  experience. I hope that your e-visit has been valuable and will speed your recovery.

## 2022-02-12 ENCOUNTER — Other Ambulatory Visit: Payer: Self-pay

## 2022-02-12 DIAGNOSIS — J189 Pneumonia, unspecified organism: Secondary | ICD-10-CM | POA: Diagnosis not present

## 2022-02-12 MED ORDER — AZITHROMYCIN 250 MG PO TABS
ORAL_TABLET | ORAL | 0 refills | Status: DC
  Filled 2022-02-12: qty 6, 5d supply, fill #0

## 2022-02-12 MED ORDER — AMOXICILLIN-POT CLAVULANATE 875-125 MG PO TABS
ORAL_TABLET | ORAL | 0 refills | Status: DC
  Filled 2022-02-12: qty 10, 5d supply, fill #0

## 2022-02-16 DIAGNOSIS — M1732 Unilateral post-traumatic osteoarthritis, left knee: Secondary | ICD-10-CM | POA: Diagnosis not present

## 2022-02-23 DIAGNOSIS — M1732 Unilateral post-traumatic osteoarthritis, left knee: Secondary | ICD-10-CM | POA: Diagnosis not present

## 2022-02-28 ENCOUNTER — Other Ambulatory Visit: Payer: Self-pay

## 2022-02-28 DIAGNOSIS — T3695XA Adverse effect of unspecified systemic antibiotic, initial encounter: Secondary | ICD-10-CM | POA: Diagnosis not present

## 2022-02-28 DIAGNOSIS — R052 Subacute cough: Secondary | ICD-10-CM | POA: Diagnosis not present

## 2022-02-28 DIAGNOSIS — B379 Candidiasis, unspecified: Secondary | ICD-10-CM | POA: Diagnosis not present

## 2022-02-28 MED ORDER — FLUCONAZOLE 150 MG PO TABS
ORAL_TABLET | ORAL | 0 refills | Status: DC
  Filled 2022-02-28: qty 2, 4d supply, fill #0

## 2022-03-02 DIAGNOSIS — M1732 Unilateral post-traumatic osteoarthritis, left knee: Secondary | ICD-10-CM | POA: Diagnosis not present

## 2022-04-05 ENCOUNTER — Other Ambulatory Visit (HOSPITAL_BASED_OUTPATIENT_CLINIC_OR_DEPARTMENT_OTHER): Payer: Self-pay

## 2022-05-02 ENCOUNTER — Other Ambulatory Visit: Payer: Self-pay

## 2022-05-02 DIAGNOSIS — M1732 Unilateral post-traumatic osteoarthritis, left knee: Secondary | ICD-10-CM | POA: Diagnosis not present

## 2022-05-02 MED ORDER — CELECOXIB 200 MG PO CAPS
200.0000 mg | ORAL_CAPSULE | Freq: Two times a day (BID) | ORAL | 0 refills | Status: DC
  Filled 2022-05-02: qty 60, 30d supply, fill #0

## 2022-05-08 ENCOUNTER — Other Ambulatory Visit: Payer: Self-pay

## 2022-05-08 MED ORDER — TRAMADOL HCL 50 MG PO TABS
50.0000 mg | ORAL_TABLET | Freq: Three times a day (TID) | ORAL | 0 refills | Status: DC | PRN
  Filled 2022-05-08: qty 21, 7d supply, fill #0

## 2022-05-28 ENCOUNTER — Other Ambulatory Visit: Payer: Self-pay

## 2022-05-28 MED ORDER — CELECOXIB 200 MG PO CAPS
ORAL_CAPSULE | ORAL | 0 refills | Status: DC
  Filled 2022-05-28: qty 60, 30d supply, fill #0

## 2022-06-11 ENCOUNTER — Encounter
Admission: RE | Admit: 2022-06-11 | Discharge: 2022-06-11 | Disposition: A | Discharge status: Home / Self Care | Payer: Commercial Managed Care - PPO | Source: Ambulatory Visit | Attending: Surgery | Admitting: Surgery

## 2022-06-11 ENCOUNTER — Other Ambulatory Visit: Payer: Self-pay | Admitting: Surgery

## 2022-06-11 VITALS — BP 145/80 | HR 96 | Temp 98.2°F | Resp 20 | Ht 64.0 in | Wt 148.0 lb

## 2022-06-11 DIAGNOSIS — I10 Essential (primary) hypertension: Secondary | ICD-10-CM | POA: Insufficient documentation

## 2022-06-11 DIAGNOSIS — Z01818 Encounter for other preprocedural examination: Secondary | ICD-10-CM | POA: Insufficient documentation

## 2022-06-11 DIAGNOSIS — Z01812 Encounter for preprocedural laboratory examination: Secondary | ICD-10-CM

## 2022-06-11 DIAGNOSIS — Z01810 Encounter for preprocedural cardiovascular examination: Secondary | ICD-10-CM | POA: Diagnosis not present

## 2022-06-11 LAB — COMPREHENSIVE METABOLIC PANEL
ALT: 78 U/L — ABNORMAL HIGH (ref 0–44)
AST: 79 U/L — ABNORMAL HIGH (ref 15–41)
Albumin: 4.6 g/dL (ref 3.5–5.0)
Alkaline Phosphatase: 88 U/L (ref 38–126)
Anion gap: 11 (ref 5–15)
BUN: 18 mg/dL (ref 6–20)
CO2: 27 mmol/L (ref 22–32)
Calcium: 9.3 mg/dL (ref 8.9–10.3)
Chloride: 99 mmol/L (ref 98–111)
Creatinine, Ser: 0.63 mg/dL (ref 0.44–1.00)
GFR, Estimated: >60 mL/min (ref >60)
Glucose, Bld: 105 mg/dL — ABNORMAL HIGH (ref 70–99)
Potassium: 3.4 mmol/L — ABNORMAL LOW (ref 3.5–5.1)
Sodium: 137 mmol/L (ref 135–145)
Total Bilirubin: 0.8 mg/dL (ref 0.3–1.2)
Total Protein: 7.4 g/dL (ref 6.5–8.1)

## 2022-06-11 LAB — TYPE AND SCREEN
ABO/RH(D): O NEG
Antibody Screen: NEGATIVE

## 2022-06-11 LAB — CBC
HCT: 39.9 % (ref 36.0–46.0)
Hemoglobin: 14.2 g/dL (ref 12.0–15.0)
MCH: 32.1 pg (ref 26.0–34.0)
MCHC: 35.6 g/dL (ref 30.0–36.0)
MCV: 90.3 fL (ref 80.0–100.0)
Platelets: 326 10*3/uL (ref 150–400)
RBC: 4.42 MIL/uL (ref 3.87–5.11)
RDW: 12.1 % (ref 11.5–15.5)
WBC: 7.4 10*3/uL (ref 4.0–10.5)
nRBC: 0 % (ref 0.0–0.2)

## 2022-06-11 LAB — SURGICAL PCR SCREEN
MRSA, PCR: NEGATIVE
Staphylococcus aureus: NEGATIVE

## 2022-06-11 LAB — URINALYSIS, ROUTINE W REFLEX MICROSCOPIC
Bacteria, UA: NONE SEEN
Bilirubin Urine: NEGATIVE
Glucose, UA: NEGATIVE mg/dL
Ketones, ur: NEGATIVE mg/dL
Nitrite: NEGATIVE
Protein, ur: 100 mg/dL — AB
Specific Gravity, Urine: 1.023 (ref 1.005–1.030)
pH: 5 (ref 5.0–8.0)

## 2022-06-11 NOTE — Patient Instructions (Signed)
Your procedure is scheduled on: Tuesday June 26, 2022 Report to the Registration Desk on the 1st floor of the [facility]. To find out your arrival time, please call [phone] between 1PM - 3PM on: Monday June 25, 2022. If your arrival time is 6:00 am, do not arrive before that time as the [facility] entrance doors do not open until 6:00 am.  REMEMBER: Instructions that are not followed completely may result in serious medical risk, up to and including death; or upon the discretion of your surgeon and anesthesiologist your surgery may need to be rescheduled.  Do not eat food after midnight the night before surgery.  No gum chewing or hard candies.  You may however, drink CLEAR liquids up to 2 hours before you are scheduled to arrive for your surgery. Do not drink anything within 2 hours of your scheduled arrival time.  Clear liquids include: - water  - apple juice without pulp - gatorade (not RED colors) - black coffee or tea (Do NOT add milk or creamers to the coffee or tea) Do NOT drink anything that is not on this list.  In addition, your doctor has ordered for you to drink the provided:  Ensure Pre-Surgery Clear Carbohydrate Drink  Drinking this carbohydrate drink up to two hours before surgery helps to reduce insulin resistance and improve patient outcomes. Please complete drinking 2 hours before scheduled arrival time.  One week prior to surgery: Stop Anti-inflammatories (NSAIDS) such as Advil, Aleve, Ibuprofen, Motrin, Naproxen, Naprosyn and Aspirin based products such as Excedrin, Goody's Powder, BC Powder. Stop ANY OVER THE COUNTER supplements until after surgery. COLLAGEN PO  Omega-3 Fatty Acids (FISH OIL   You may however, continue to take Tylenol if needed for pain up until the day of surgery.  Continue taking all prescribed medications with the exception of the following:  Follow recommendations from Cardiologist or PCP regarding stopping blood  thinners.  TAKE ONLY THESE MEDICATIONS THE MORNING OF SURGERY WITH A SIP OF WATER:  amLODipine (NORVASC) 10 MG  citalopram (CELEXA) 20 MG  ezetimibe (ZETIA) 10 MG  rosuvastatin (CRESTOR) 10 MG    No Alcohol for 24 hours before or after surgery.  No Smoking including e-cigarettes for 24 hours before surgery.  No chewable tobacco products for at least 6 hours before surgery.  No nicotine patches on the day of surgery.  Do not use any "recreational" drugs for at least a week (preferably 2 weeks) before your surgery.  Please be advised that the combination of cocaine and anesthesia may have negative outcomes, up to and including death. If you test positive for cocaine, your surgery will be cancelled.  On the morning of surgery brush your teeth with toothpaste and water, you may rinse your mouth with mouthwash if you wish. Do not swallow any toothpaste or mouthwash.  Use CHG Soap or wipes as directed on instruction sheet.  Do not wear jewelry, make-up, hairpins, clips or nail polish.  Do not wear lotions, powders, or perfumes.   Do not shave body hair from the neck down 48 hours before surgery.  Contact lenses, hearing aids and dentures may not be worn into surgery.  Do not bring valuables to the hospital. [facility] is not responsible for any missing/lost belongings or valuables.   Total Shoulder Arthroplasty:  use Benzoyl Peroxide 5% Gel as directed on instruction sheet.  Bring your C-PAP to the hospital in case you may have to spend the night.   Notify your doctor  if there is any change in your medical condition (cold, fever, infection).  Wear comfortable clothing (specific to your surgery type) to the hospital.  After surgery, you can help prevent lung complications by doing breathing exercises.  Take deep breaths and cough every 1-2 hours. Your doctor may order a device called an Incentive Spirometer to help you take deep breaths. When coughing or sneezing, hold a pillow  firmly against your incision with both hands. This is called "splinting." Doing this helps protect your incision. It also decreases belly discomfort.  If you are being admitted to the hospital overnight, leave your suitcase in the car. After surgery it may be brought to your room.  In case of increased patient census, it may be necessary for you, the patient, to continue your postoperative care in the Same Day Surgery department.  If you are being discharged the day of surgery, you will not be allowed to drive home. You will need a responsible individual to drive you home and stay with you for 24 hours after surgery.   If you are taking public transportation, you will need to have a responsible individual with you.  Please call the [facility] at [phone] if you have any questions about these instructions.  Surgery Visitation Policy:  Patients undergoing a surgery or procedure may have two family members or support persons with them as long as the person is not COVID-19 positive or experiencing its symptoms.   Inpatient Visitation:    Visiting hours are 7 a.m. to 8 p.m. Up to four visitors are allowed at one time in a patient room. The visitors may rotate out with other people during the day. One designated support person (adult) may remain overnight.  Due to an increase in RSV and influenza rates and associated hospitalizations, children ages 68 and under will not be able to visit patients in [facility]. Masks continue to be strongly recommended.    Preparing for Surgery with CHLORHEXIDINE GLUCONATE (CHG) Soap  Chlorhexidine Gluconate (CHG) Soap  o An antiseptic cleaner that kills germs and bonds with the skin to continue killing germs even after washing  o Used for showering the night before surgery and morning of surgery  Before surgery, you can play an important role by reducing the number of germs on your skin.  CHG (Chlorhexidine gluconate)  soap is an antiseptic cleanser which kills germs and bonds with the skin to continue killing germs even after washing.  Please do not use if you have an allergy to CHG or antibacterial soaps. If your skin becomes reddened/irritated stop using the CHG.  1. Shower the NIGHT BEFORE SURGERY and the MORNING OF SURGERY with CHG soap.  2. If you choose to wash your hair, wash your hair first as usual with your normal shampoo.  3. After shampooing, rinse your hair and body thoroughly to remove the shampoo.  4. Use CHG as you would any other liquid soap. You can apply CHG directly to the skin and wash gently with a scrungie or a clean washcloth.  5. Apply the CHG soap to your body only from the neck down. Do not use on open wounds or open sores. Avoid contact with your eyes, ears, mouth, and genitals (private parts). Wash face and genitals (private parts) with your normal soap.  6. Wash thoroughly, paying special attention to the area where your surgery will be performed.  7. Thoroughly rinse your body with warm water.  8. Do not shower/wash with  your normal soap after using and rinsing off the CHG soap.  9. Pat yourself dry with a clean towel.  10. Wear clean pajamas to bed the night before surgery.  12. Place clean sheets on your bed the night of your first shower and do not sleep with pets.  13. Shower again with the CHG soap on the day of surgery prior to arriving at the hospital.  14. Do not apply any deodorants/lotions/powders.  15. Please wear clean clothes to the hospital.How to Use an Incentive Spirometer  An incentive spirometer is a tool that measures how well you are filling your lungs with each breath. Learning to take long, deep breaths using this tool can help you keep your lungs clear and active. This may help to reverse or lessen your chance of developing breathing (pulmonary) problems, especially infection. You may be asked to use a spirometer: After a surgery. If you have  a lung problem or a history of smoking. After a long period of time when you have been unable to move or be active. If the spirometer includes an indicator to show the highest number that you have reached, your health care provider or respiratory therapist will help you set a goal. Keep a log of your progress as told by your health care provider. What are the risks? Breathing too quickly may cause dizziness or cause you to pass out. Take your time so you do not get dizzy or light-headed. If you are in pain, you may need to take pain medicine before doing incentive spirometry. It is harder to take a deep breath if you are having pain. How to use your incentive spirometer  Sit up on the edge of your bed or on a chair. Hold the incentive spirometer so that it is in an upright position. Before you use the spirometer, breathe out normally. Place the mouthpiece in your mouth. Make sure your lips are closed tightly around it. Breathe in slowly and as deeply as you can through your mouth, causing the piston or the ball to rise toward the top of the chamber. Hold your breath for 3-5 seconds, or for as long as possible. If the spirometer includes a coach indicator, use this to guide you in breathing. Slow down your breathing if the indicator goes above the marked areas. Remove the mouthpiece from your mouth and breathe out normally. The piston or ball will return to the bottom of the chamber. Rest for a few seconds, then repeat the steps 10 or more times. Take your time and take a few normal breaths between deep breaths so that you do not get dizzy or light-headed. Do this every 1-2 hours when you are awake. If the spirometer includes a goal marker to show the highest number you have reached (best effort), use this as a goal to work toward during each repetition. After each set of 10 deep breaths, cough a few times. This will help to make sure that your lungs are clear. If you have an incision on your  chest or abdomen from surgery, place a pillow or a rolled-up towel firmly against the incision when you cough. This can help to reduce pain while taking deep breaths and coughing. General tips When you are able to get out of bed: Walk around often. Continue to take deep breaths and cough in order to clear your lungs. Keep using the incentive spirometer until your health care provider says it is okay to stop using it. If you have  been in the hospital, you may be told to keep using the spirometer at home. Contact a health care provider if: You are having difficulty using the spirometer. You have trouble using the spirometer as often as instructed. Your pain medicine is not giving enough relief for you to use the spirometer as told. You have a fever. Get help right away if: You develop shortness of breath. You develop a cough with bloody mucus from the lungs. You have fluid or blood coming from an incision site after you cough. Summary An incentive spirometer is a tool that can help you learn to take long, deep breaths to keep your lungs clear and active. You may be asked to use a spirometer after a surgery, if you have a lung problem or a history of smoking, or if you have been inactive for a long period of time. Use your incentive spirometer as instructed every 1-2 hours while you are awake. If you have an incision on your chest or abdomen, place a pillow or a rolled-up towel firmly against your incision when you cough. This will help to reduce pain. Get help right away if you have shortness of breath, you cough up bloody mucus, or blood comes from your incision when you cough. This information is not intended to replace advice given to you by your health care provider. Make sure you discuss any questions you have with your health care provider. Document Revised: 06/22/2019 Document Reviewed: 06/22/2019 Elsevier Patient Education  [place].

## 2022-06-13 ENCOUNTER — Other Ambulatory Visit: Payer: Self-pay

## 2022-06-13 DIAGNOSIS — M1732 Unilateral post-traumatic osteoarthritis, left knee: Secondary | ICD-10-CM | POA: Diagnosis not present

## 2022-06-13 LAB — URINE CULTURE: Culture: <10000 — AB

## 2022-06-13 MED ORDER — TRAMADOL HCL 50 MG PO TABS
50.0000 mg | ORAL_TABLET | Freq: Three times a day (TID) | ORAL | 0 refills | Status: DC | PRN
  Filled 2022-06-13: qty 21, 7d supply, fill #0

## 2022-06-25 MED ORDER — LACTATED RINGERS IV SOLN: INTRAVENOUS | Status: DC

## 2022-06-25 MED ORDER — FAMOTIDINE 20 MG PO TABS: 20.0000 mg | ORAL_TABLET | Freq: Once | ORAL | Status: DC

## 2022-06-25 MED ORDER — CEFAZOLIN SODIUM-DEXTROSE 2-4 GM/100ML-% IV SOLN
2.0000 g | INTRAVENOUS | Status: AC
  Administered 2022-06-26: 2 g via INTRAVENOUS

## 2022-06-25 MED ORDER — ORAL CARE MOUTH RINSE: 15.0000 mL | Freq: Once | OROMUCOSAL | Status: AC

## 2022-06-25 MED ORDER — CHLORHEXIDINE GLUCONATE 0.12 % MT SOLN
15.0000 mL | Freq: Once | OROMUCOSAL | Status: AC
  Administered 2022-06-26: 15 mL via OROMUCOSAL

## 2022-06-26 ENCOUNTER — Encounter: Payer: Self-pay | Admitting: Surgery

## 2022-06-26 ENCOUNTER — Ambulatory Visit: Payer: Commercial Managed Care - PPO | Admitting: Urgent Care

## 2022-06-26 ENCOUNTER — Ambulatory Visit: Payer: Commercial Managed Care - PPO

## 2022-06-26 ENCOUNTER — Ambulatory Visit
Admission: RE | Admit: 2022-06-26 | Discharge: 2022-06-26 | Disposition: A | Discharge status: Home / Self Care | Payer: Commercial Managed Care - PPO | Attending: Surgery | Admitting: Surgery

## 2022-06-26 ENCOUNTER — Other Ambulatory Visit: Payer: Self-pay

## 2022-06-26 ENCOUNTER — Encounter
Admission: RE | Disposition: A | Discharge status: Home / Self Care | Payer: Self-pay | Source: Home / Self Care | Attending: Surgery

## 2022-06-26 DIAGNOSIS — Z96652 Presence of left artificial knee joint: Secondary | ICD-10-CM | POA: Diagnosis not present

## 2022-06-26 DIAGNOSIS — Z9889 Other specified postprocedural states: Secondary | ICD-10-CM | POA: Diagnosis not present

## 2022-06-26 DIAGNOSIS — I10 Essential (primary) hypertension: Secondary | ICD-10-CM | POA: Diagnosis not present

## 2022-06-26 DIAGNOSIS — Z09 Encounter for follow-up examination after completed treatment for conditions other than malignant neoplasm: Secondary | ICD-10-CM | POA: Diagnosis not present

## 2022-06-26 DIAGNOSIS — F418 Other specified anxiety disorders: Secondary | ICD-10-CM | POA: Insufficient documentation

## 2022-06-26 DIAGNOSIS — R8290 Unspecified abnormal findings in urine: Secondary | ICD-10-CM

## 2022-06-26 DIAGNOSIS — M1732 Unilateral post-traumatic osteoarthritis, left knee: Secondary | ICD-10-CM | POA: Diagnosis not present

## 2022-06-26 DIAGNOSIS — Z87891 Personal history of nicotine dependence: Secondary | ICD-10-CM | POA: Diagnosis not present

## 2022-06-26 DIAGNOSIS — Z01812 Encounter for preprocedural laboratory examination: Secondary | ICD-10-CM

## 2022-06-26 DIAGNOSIS — Z471 Aftercare following joint replacement surgery: Secondary | ICD-10-CM | POA: Diagnosis not present

## 2022-06-26 HISTORY — PX: PARTIAL KNEE ARTHROPLASTY: SHX2174

## 2022-06-26 SURGERY — ARTHROPLASTY, KNEE, UNICOMPARTMENTAL
Anesthesia: General | Site: Knee | Laterality: Left

## 2022-06-26 MED ORDER — METOCLOPRAMIDE HCL 5 MG/ML IJ SOLN: 5.0000 mg | Freq: Three times a day (TID) | INTRAMUSCULAR | Status: DC | PRN

## 2022-06-26 MED ORDER — ONDANSETRON HCL 4 MG/2ML IJ SOLN
INTRAMUSCULAR | Status: AC
  Filled 2022-06-26: qty 2

## 2022-06-26 MED ORDER — FENTANYL CITRATE (PF) 100 MCG/2ML IJ SOLN
INTRAMUSCULAR | Status: AC
  Filled 2022-06-26: qty 2

## 2022-06-26 MED ORDER — CHLORHEXIDINE GLUCONATE 0.12 % MT SOLN
OROMUCOSAL | Status: AC
  Filled 2022-06-26: qty 15

## 2022-06-26 MED ORDER — TRIAMCINOLONE ACETONIDE 40 MG/ML IJ SUSP
INTRAMUSCULAR | Status: AC
  Filled 2022-06-26: qty 2

## 2022-06-26 MED ORDER — SODIUM CHLORIDE 0.9 % BOLUS PEDS
250.0000 mL | Freq: Once | INTRAVENOUS | Status: AC
  Administered 2022-06-26: 250 mL via INTRAVENOUS

## 2022-06-26 MED ORDER — OXYCODONE HCL 5 MG PO TABS
ORAL_TABLET | ORAL | Status: AC
  Administered 2022-06-26: 5 mg via ORAL
  Filled 2022-06-26: qty 1

## 2022-06-26 MED ORDER — ACETAMINOPHEN 10 MG/ML IV SOLN: 1000.0000 mg | Freq: Once | INTRAVENOUS | Status: DC | PRN

## 2022-06-26 MED ORDER — BUPIVACAINE-EPINEPHRINE (PF) 0.5% -1:200000 IJ SOLN
INTRAMUSCULAR | Status: DC | PRN
  Administered 2022-06-26: 30 mL

## 2022-06-26 MED ORDER — LACTATED RINGERS IV SOLN: INTRAVENOUS | Status: DC

## 2022-06-26 MED ORDER — PROPOFOL 500 MG/50ML IV EMUL
INTRAVENOUS | Status: DC | PRN
  Administered 2022-06-26: 50 ug/kg/min via INTRAVENOUS

## 2022-06-26 MED ORDER — GLYCOPYRROLATE 0.2 MG/ML IJ SOLN
INTRAMUSCULAR | Status: AC
  Filled 2022-06-26: qty 1

## 2022-06-26 MED ORDER — HYDROCODONE-ACETAMINOPHEN 5-325 MG PO TABS
1.0000 | ORAL_TABLET | Freq: Four times a day (QID) | ORAL | 0 refills | Status: DC | PRN
  Filled 2022-06-26: qty 40, 5d supply, fill #0

## 2022-06-26 MED ORDER — SODIUM CHLORIDE FLUSH 0.9 % IV SOLN
INTRAVENOUS | Status: AC
  Filled 2022-06-26: qty 10

## 2022-06-26 MED ORDER — SODIUM CHLORIDE (PF) 0.9 % IJ SOLN
INTRAMUSCULAR | Status: DC | PRN
  Administered 2022-06-26: 10 mL

## 2022-06-26 MED ORDER — SODIUM CHLORIDE 0.9 % IV SOLN: INTRAVENOUS | Status: DC

## 2022-06-26 MED ORDER — DEXAMETHASONE SODIUM PHOSPHATE 10 MG/ML IJ SOLN
INTRAMUSCULAR | Status: AC
  Filled 2022-06-26: qty 1

## 2022-06-26 MED ORDER — BUPIVACAINE HCL (PF) 0.5 % IJ SOLN
INTRAMUSCULAR | Status: AC
  Filled 2022-06-26: qty 10

## 2022-06-26 MED ORDER — METOCLOPRAMIDE HCL 10 MG PO TABS: 5.0000 mg | ORAL_TABLET | Freq: Three times a day (TID) | ORAL | Status: DC | PRN

## 2022-06-26 MED ORDER — CEFAZOLIN SODIUM-DEXTROSE 2-4 GM/100ML-% IV SOLN
INTRAVENOUS | Status: AC
  Filled 2022-06-26: qty 100

## 2022-06-26 MED ORDER — DEXMEDETOMIDINE HCL IN NACL 80 MCG/20ML IV SOLN
INTRAVENOUS | Status: AC
  Filled 2022-06-26: qty 20

## 2022-06-26 MED ORDER — 0.9 % SODIUM CHLORIDE (POUR BTL) OPTIME
TOPICAL | Status: DC | PRN
  Administered 2022-06-26: 500 mL

## 2022-06-26 MED ORDER — BUPIVACAINE HCL (PF) 0.5 % IJ SOLN
INTRAMUSCULAR | Status: AC
  Filled 2022-06-26: qty 30

## 2022-06-26 MED ORDER — TRIAMCINOLONE ACETONIDE 40 MG/ML IJ SUSP
INTRAMUSCULAR | Status: DC | PRN
  Administered 2022-06-26: 80 mg

## 2022-06-26 MED ORDER — PROPOFOL 10 MG/ML IV BOLUS
INTRAVENOUS | Status: AC
  Filled 2022-06-26: qty 20

## 2022-06-26 MED ORDER — HYDROCODONE-ACETAMINOPHEN 5-325 MG PO TABS: 1.0000 | ORAL_TABLET | Freq: Four times a day (QID) | ORAL | 0 refills | Status: DC | PRN

## 2022-06-26 MED ORDER — DEXMEDETOMIDINE HCL IN NACL 200 MCG/50ML IV SOLN
INTRAVENOUS | Status: DC | PRN
  Administered 2022-06-26: 4 ug via INTRAVENOUS
  Administered 2022-06-26 (×2): 8 ug via INTRAVENOUS

## 2022-06-26 MED ORDER — LIDOCAINE HCL (CARDIAC) PF 100 MG/5ML IV SOSY
PREFILLED_SYRINGE | INTRAVENOUS | Status: DC | PRN
  Administered 2022-06-26: 80 mg via INTRAVENOUS

## 2022-06-26 MED ORDER — KETOROLAC TROMETHAMINE 30 MG/ML IJ SOLN
INTRAMUSCULAR | Status: AC
  Administered 2022-06-26: 30 mg via INTRAVENOUS
  Filled 2022-06-26: qty 1

## 2022-06-26 MED ORDER — OXYCODONE HCL 5 MG PO TABS: 5.0000 mg | ORAL_TABLET | Freq: Once | ORAL | Status: AC | PRN

## 2022-06-26 MED ORDER — PHENYLEPHRINE HCL-NACL 20-0.9 MG/250ML-% IV SOLN
INTRAVENOUS | Status: DC | PRN
  Administered 2022-06-26: 30 ug/min via INTRAVENOUS

## 2022-06-26 MED ORDER — KETOROLAC TROMETHAMINE 30 MG/ML IJ SOLN
INTRAMUSCULAR | Status: AC
  Filled 2022-06-26: qty 1

## 2022-06-26 MED ORDER — CEFAZOLIN SODIUM-DEXTROSE 2-4 GM/100ML-% IV SOLN
INTRAVENOUS | Status: AC
  Administered 2022-06-26: 2 g via INTRAVENOUS
  Filled 2022-06-26: qty 100

## 2022-06-26 MED ORDER — ACETAMINOPHEN 10 MG/ML IV SOLN
INTRAVENOUS | Status: AC
  Filled 2022-06-26: qty 100

## 2022-06-26 MED ORDER — BUPIVACAINE LIPOSOME 1.3 % IJ SUSP
INTRAMUSCULAR | Status: DC | PRN
  Administered 2022-06-26: 20 mL

## 2022-06-26 MED ORDER — PROPOFOL 1000 MG/100ML IV EMUL
INTRAVENOUS | Status: AC
  Filled 2022-06-26: qty 100

## 2022-06-26 MED ORDER — FENTANYL CITRATE (PF) 100 MCG/2ML IJ SOLN
INTRAMUSCULAR | Status: DC | PRN
  Administered 2022-06-26 (×4): 25 ug via INTRAVENOUS

## 2022-06-26 MED ORDER — SODIUM CHLORIDE 0.9 % IR SOLN
Status: DC | PRN
  Administered 2022-06-26: 3000 mL

## 2022-06-26 MED ORDER — ONDANSETRON HCL 4 MG/2ML IJ SOLN: 4.0000 mg | Freq: Four times a day (QID) | INTRAMUSCULAR | Status: DC | PRN

## 2022-06-26 MED ORDER — BUPIVACAINE LIPOSOME 1.3 % IJ SUSP
INTRAMUSCULAR | Status: AC
  Filled 2022-06-26: qty 20

## 2022-06-26 MED ORDER — EPINEPHRINE PF 1 MG/ML IJ SOLN
INTRAMUSCULAR | Status: AC
  Filled 2022-06-26: qty 1

## 2022-06-26 MED ORDER — ELIQUIS 2.5 MG PO TABS
2.5000 mg | ORAL_TABLET | Freq: Two times a day (BID) | ORAL | 0 refills | Status: DC
  Filled 2022-06-26: qty 30, 15d supply, fill #0

## 2022-06-26 MED ORDER — TRANEXAMIC ACID 1000 MG/10ML IV SOLN
INTRAVENOUS | Status: DC | PRN
  Administered 2022-06-26: 1000 mg via TOPICAL

## 2022-06-26 MED ORDER — HYDROCODONE-ACETAMINOPHEN 5-325 MG PO TABS
ORAL_TABLET | ORAL | Status: AC
  Filled 2022-06-26: qty 2

## 2022-06-26 MED ORDER — MIDAZOLAM HCL 5 MG/5ML IJ SOLN
INTRAMUSCULAR | Status: DC | PRN
  Administered 2022-06-26: 2 mg via INTRAVENOUS

## 2022-06-26 MED ORDER — FAMOTIDINE 20 MG PO TABS
ORAL_TABLET | ORAL | Status: AC
  Filled 2022-06-26: qty 1

## 2022-06-26 MED ORDER — MIDAZOLAM HCL 2 MG/2ML IJ SOLN
INTRAMUSCULAR | Status: AC
  Filled 2022-06-26: qty 2

## 2022-06-26 MED ORDER — BUPIVACAINE HCL (PF) 0.5 % IJ SOLN
INTRAMUSCULAR | Status: DC | PRN
  Administered 2022-06-26: 2.6 mL

## 2022-06-26 MED ORDER — PROPOFOL 10 MG/ML IV BOLUS
INTRAVENOUS | Status: DC | PRN
  Administered 2022-06-26: 20 mg via INTRAVENOUS
  Administered 2022-06-26 (×2): 30 mg via INTRAVENOUS
  Administered 2022-06-26: 20 mg via INTRAVENOUS

## 2022-06-26 MED ORDER — HYDROCODONE-ACETAMINOPHEN 5-325 MG PO TABS: 1.0000 | ORAL_TABLET | ORAL | Status: DC | PRN

## 2022-06-26 MED ORDER — KETOROLAC TROMETHAMINE 30 MG/ML IJ SOLN
INTRAMUSCULAR | Status: DC | PRN
  Administered 2022-06-26: 30 mg via INTRAMUSCULAR

## 2022-06-26 MED ORDER — OXYCODONE HCL 5 MG/5ML PO SOLN: 5.0000 mg | Freq: Once | ORAL | Status: AC | PRN

## 2022-06-26 MED ORDER — FENTANYL CITRATE (PF) 100 MCG/2ML IJ SOLN
25.0000 ug | INTRAMUSCULAR | Status: DC | PRN
  Administered 2022-06-26: 50 ug via INTRAVENOUS

## 2022-06-26 MED ORDER — ONDANSETRON HCL 4 MG/2ML IJ SOLN: 4.0000 mg | Freq: Once | INTRAMUSCULAR | Status: DC | PRN

## 2022-06-26 MED ORDER — APIXABAN 2.5 MG PO TABS: 2.5000 mg | ORAL_TABLET | Freq: Two times a day (BID) | ORAL | 0 refills | Status: DC

## 2022-06-26 MED ORDER — TRANEXAMIC ACID 1000 MG/10ML IV SOLN
INTRAVENOUS | Status: AC
  Filled 2022-06-26: qty 10

## 2022-06-26 MED ORDER — KETOROLAC TROMETHAMINE 30 MG/ML IJ SOLN: 30.0000 mg | Freq: Once | INTRAMUSCULAR | Status: AC

## 2022-06-26 MED ORDER — CEFAZOLIN SODIUM-DEXTROSE 2-4 GM/100ML-% IV SOLN: 2.0000 g | Freq: Four times a day (QID) | INTRAVENOUS | Status: DC

## 2022-06-26 MED ORDER — ONDANSETRON HCL 4 MG PO TABS: 4.0000 mg | ORAL_TABLET | Freq: Four times a day (QID) | ORAL | Status: DC | PRN

## 2022-06-26 MED ORDER — ACETAMINOPHEN 10 MG/ML IV SOLN
INTRAVENOUS | Status: DC | PRN
  Administered 2022-06-26: 1000 mg via INTRAVENOUS

## 2022-06-26 MED ORDER — PHENYLEPHRINE HCL-NACL 20-0.9 MG/250ML-% IV SOLN
INTRAVENOUS | Status: AC
  Filled 2022-06-26: qty 250

## 2022-06-26 SURGICAL SUPPLY — 64 items
APL PRP STRL LF DISP 70% ISPRP (MISCELLANEOUS) ×2
BEARING MENISCAL TIBIAL 5 XS L (Orthopedic Implant) IMPLANT
BIT DRILL QUICK REL 1/8 2PK SL (DRILL) IMPLANT
BNDG ELASTIC 6X5.8 VLCR STR LF (GAUZE/BANDAGES/DRESSINGS) ×1 IMPLANT
BRNG TIB XS 5 PHS 3 UNCMP (Orthopedic Implant) ×1 IMPLANT
CEMENT BONE R 1X40 (Cement) ×1 IMPLANT
CEMENT VACUUM MIXING SYSTEM (MISCELLANEOUS) ×1 IMPLANT
CHLORAPREP W/TINT 26 (MISCELLANEOUS) ×2 IMPLANT
COOLER POLAR GLACIER W/PUMP (MISCELLANEOUS) ×1 IMPLANT
COVER MAYO STAND REUSABLE (DRAPES) ×1 IMPLANT
CUFF TOURN SGL QUICK 24 (TOURNIQUET CUFF)
CUFF TOURN SGL QUICK 34 (TOURNIQUET CUFF)
CUFF TRNQT CYL 24X4X16.5-23 (TOURNIQUET CUFF) IMPLANT
CUFF TRNQT CYL 34X4.125X (TOURNIQUET CUFF) IMPLANT
DRAPE C-ARM XRAY 36X54 (DRAPES) IMPLANT
DRAPE U-SHAPE 47X51 STRL (DRAPES) ×2 IMPLANT
DRILL QUICK RELEASE 1/8 INCH (DRILL) ×1
DRSG MEPILEX SACRM 8.7X9.8 (GAUZE/BANDAGES/DRESSINGS) ×1 IMPLANT
DRSG OPSITE POSTOP 4X12 (GAUZE/BANDAGES/DRESSINGS) ×1 IMPLANT
DRSG OPSITE POSTOP 4X6 (GAUZE/BANDAGES/DRESSINGS) ×1 IMPLANT
ELECT CAUTERY BLADE 6.4 (BLADE) ×1 IMPLANT
ELECT REM PT RETURN 9FT ADLT (ELECTROSURGICAL) ×1
ELECTRODE REM PT RTRN 9FT ADLT (ELECTROSURGICAL) ×1 IMPLANT
GAUZE SPONGE 4X4 12PLY STRL (GAUZE/BANDAGES/DRESSINGS) ×1 IMPLANT
GAUZE SPONGE 4X4 16PLY XRAY LF (GAUZE/BANDAGES/DRESSINGS) IMPLANT
GAUZE XEROFORM 1X8 LF (GAUZE/BANDAGES/DRESSINGS) ×1 IMPLANT
GLOVE BIO SURGEON STRL SZ7.5 (GLOVE) ×4 IMPLANT
GLOVE BIO SURGEON STRL SZ8 (GLOVE) ×4 IMPLANT
GLOVE BIOGEL PI IND STRL 8 (GLOVE) ×1 IMPLANT
GOWN STRL REUS W/ TWL LRG LVL3 (GOWN DISPOSABLE) ×1 IMPLANT
GOWN STRL REUS W/ TWL XL LVL3 (GOWN DISPOSABLE) ×1 IMPLANT
GOWN STRL REUS W/TWL LRG LVL3 (GOWN DISPOSABLE) ×1
GOWN STRL REUS W/TWL XL LVL3 (GOWN DISPOSABLE) ×2
HOOD PEEL AWAY T7 (MISCELLANEOUS) ×3 IMPLANT
IV NS IRRIG 3000ML ARTHROMATIC (IV SOLUTION) ×1 IMPLANT
KIT TURNOVER KIT A (KITS) ×1 IMPLANT
KNEE PARTIAL CEMENT FEM XS (Miscellaneous) IMPLANT
MANIFOLD NEPTUNE II (INSTRUMENTS) ×1 IMPLANT
MAT ABSORB  FLUID 56X50 GRAY (MISCELLANEOUS) ×1
MAT ABSORB FLUID 56X50 GRAY (MISCELLANEOUS) ×1 IMPLANT
NDL SAFETY ECLIP 18X1.5 (MISCELLANEOUS) ×1 IMPLANT
NDL SPNL 20GX3.5 QUINCKE YW (NEEDLE) ×1 IMPLANT
NEEDLE SPNL 20GX3.5 QUINCKE YW (NEEDLE) ×1 IMPLANT
NS IRRIG 1000ML POUR BTL (IV SOLUTION) ×1 IMPLANT
PACK BLADE SAW RECIP 70 3 PT (BLADE) IMPLANT
PACK TOTAL KNEE (MISCELLANEOUS) ×1 IMPLANT
PAD ABD DERMACEA PRESS 5X9 (GAUZE/BANDAGES/DRESSINGS) ×2 IMPLANT
PAD WRAPON POLAR KNEE (MISCELLANEOUS) ×1 IMPLANT
PULSAVAC PLUS IRRIG FAN TIP (DISPOSABLE) ×1
STAPLER SKIN PROX 35W (STAPLE) ×1 IMPLANT
STRAP SAFETY 5IN WIDE (MISCELLANEOUS) ×1 IMPLANT
SUCTION FRAZIER HANDLE 10FR (MISCELLANEOUS) ×1
SUCTION TUBE FRAZIER 10FR DISP (MISCELLANEOUS) ×1 IMPLANT
SUT VIC AB 0 CT1 36 (SUTURE) ×1 IMPLANT
SUT VIC AB 2-0 CT1 27 (SUTURE) ×2
SUT VIC AB 2-0 CT1 TAPERPNT 27 (SUTURE) ×4 IMPLANT
SYR 10ML LL (SYRINGE) ×1 IMPLANT
SYR 30ML LL (SYRINGE) ×2 IMPLANT
TAPE TRANSPORE STRL 2 31045 (GAUZE/BANDAGES/DRESSINGS) ×1 IMPLANT
TIP FAN IRRIG PULSAVAC PLUS (DISPOSABLE) ×1 IMPLANT
TRAP FLUID SMOKE EVACUATOR (MISCELLANEOUS) ×1 IMPLANT
TRAY TIBIAL KNEE OXFORD STD AA (Joint) IMPLANT
WATER STERILE IRR 500ML POUR (IV SOLUTION) ×1 IMPLANT
WRAPON POLAR PAD KNEE (MISCELLANEOUS) ×1

## 2022-06-26 NOTE — Progress Notes (Signed)
Patient is not able to walk the distance required to go the bathroom, or he/she is unable to safely negotiate stairs required to access the bathroom.  A 3in1 BSC will alleviate this problem  

## 2022-06-26 NOTE — Anesthesia Preprocedure Evaluation (Addendum)
Anesthesia Evaluation  Patient identified by MRN, date of birth, ID band Patient awake    Reviewed: Allergy & Precautions, NPO status , Patient's Chart, lab work & pertinent test results  History of Anesthesia Complications Negative for: history of anesthetic complications  Airway Mallampati: III   Neck ROM: Full    Dental no notable dental hx.    Pulmonary former smoker (quit 2016)   Pulmonary exam normal breath sounds clear to auscultation       Cardiovascular Exercise Tolerance: Good hypertension, Normal cardiovascular exam Rhythm:Regular Rate:Normal  ECG 06/11/22: normal   Neuro/Psych  PSYCHIATRIC DISORDERS Anxiety Depression    negative neurological ROS     GI/Hepatic negative GI ROS,,,  Endo/Other  negative endocrine ROS    Renal/GU Renal disease (nephrolithiasis)     Musculoskeletal  (+) Arthritis ,    Abdominal   Peds  Hematology negative hematology ROS (+)   Anesthesia Other Findings   Reproductive/Obstetrics                             Anesthesia Physical Anesthesia Plan  ASA: 2  Anesthesia Plan: General and Spinal   Post-op Pain Management:    Induction: Intravenous  PONV Risk Score and Plan: 3 and Propofol infusion, TIVA, Treatment may vary due to age or medical condition and Ondansetron  Airway Management Planned: Natural Airway and Nasal Cannula  Additional Equipment:   Intra-op Plan:   Post-operative Plan:   Informed Consent: I have reviewed the patients History and Physical, chart, labs and discussed the procedure including the risks, benefits and alternatives for the proposed anesthesia with the patient or authorized representative who has indicated his/her understanding and acceptance.       Plan Discussed with: CRNA  Anesthesia Plan Comments: (Plan for spinal and GA with natural airway, LMA/GETA backup.  Patient consented for risks of anesthesia  including but not limited to:  - adverse reactions to medications - damage to eyes, teeth, lips or other oral mucosa - nerve damage due to positioning  - sore throat or hoarseness - headache, bleeding, infection, nerve damage 2/2 spinal - damage to heart, brain, nerves, lungs, other parts of body or loss of life  Informed patient about role of CRNA in peri- and intra-operative care.  Patient voiced understanding.)        Anesthesia Quick Evaluation

## 2022-06-26 NOTE — Discharge Instructions (Addendum)
Orthopedic discharge instructions: May sponge bathe or shower with intact OpSite dressing. Apply ice frequently to knee or use Polar Care. Start Eliquis 1 tablet (2.5 mg) twice daily on Wednesday, 06/27/2022, for 2 weeks, then take aspirin 325 mg twice daily for 4 weeks. Take pain medication as prescribed when needed.  May supplement with ES Tylenol if necessary. May weight-bear as tolerated on right leg - use walker for balance and support. Follow-up in 10-14 days or as scheduled.  AMBULATORY SURGERY  DISCHARGE INSTRUCTIONS   The drugs that you were given will stay in your system until tomorrow so for the next 24 hours you should not:  Drive an automobile Make any legal decisions Drink any alcoholic beverage   You may resume regular meals tomorrow.  Today it is better to start with liquids and gradually work up to solid foods.  You may eat anything you prefer, but it is better to start with liquids, then soup and crackers, and gradually work up to solid foods.   Please notify your doctor immediately if you have any unusual bleeding, trouble breathing, redness and pain at the surgery site, drainage, fever, or pain not relieved by medication.    Additional Instructions: PLEASE LEAVE GREEN/TEAL ARMBAND ON FOR 4 DAYS    Please contact your physician with any problems or Same Day Surgery at [phone], Monday through Friday 6 am to 4 pm, or [place] at [facility] number at [phone].   POLAR CARE INFORMATION  [email or web address]  How to use [place] Cold Therapy System?  YouTube   [email or web address]  OPERATING INSTRUCTIONS  Start the product With dry hands, connect the transformer to the electrical connection located on the top of the cooler. Next, plug the transformer into an appropriate electrical outlet. The unit will automatically start running at this point.  To stop the pump, disconnect electrical power.  Unplug to stop the  product when not in use. Unplugging the Polar Care unit turns it off. Always unplug immediately after use. Never leave it plugged in while unattended. Remove pad.    FIRST ADD WATER TO FILL LINE, THEN ICE---Replace ice when existing ice is almost melted  1 Discuss Treatment with your [place] Practitioner and Use Only as Prescribed 2 Apply Insulation Barrier & Cold Therapy Pad 3 Check for Moisture 4 Inspect Skin Regularly  Tips and Trouble Shooting Usage Tips 1. Use cubed or chunked ice for optimal performance. 2. It is recommended to drain the Pad between uses. To drain the pad, hold the Pad upright with the hose pointed toward the ground. Depress the black plunger and allow water to drain out. 3. You may disconnect the Pad from the unit without removing the pad from the affected area by depressing the silver tabs on the hose coupling and gently pulling the hoses apart. The Pad and unit will seal itself and will not leak. Note: Some dripping during release is normal. 4. DO NOT RUN PUMP WITHOUT WATER! The pump in this unit is designed to run with water. Running the unit without water will cause permanent damage to the pump. 5. Unplug unit before removing lid.  TROUBLESHOOTING GUIDE Pump not running, Water not flowing to the pad, Pad is not getting cold 1. Make sure the transformer is plugged into the wall outlet. 2. Confirm that the ice and water are filled to the indicated levels. 3. Make sure there are no kinks in the pad. 4. Gently pull on the blue tube to  make sure the tube/pad junction is straight. 5. Remove the pad from the treatment site and ll it while the pad is lying at; then reapply. 6. Confirm that the pad couplings are securely attached to the unit. Listen for the double clicks (Figure 1) to confirm the pad couplings are securely attached.  Leaks    Note: Some condensation on the lines, controller, and pads is unavoidable, especially in warmer climates. 1. If using  a [place] Polar Care Cold Therapy unit with a detachable Cold Therapy Pad, and a leak exists (other than condensation on the lines) disconnect the pad couplings. Make sure the silver tabs on the couplings are depressed before reconnecting the pad to the pump hose; then confirm both sides of the coupling are properly clicked in. 2. If the coupling continues to leak or a leak is detected in the pad itself, stop using it and call [place] at [phone].  Cleaning After use, empty and dry the unit with a soft cloth. Warm water and mild detergent may be used occasionally to clean the pump and tubes.  WARNING: The [place] can be cold enough to cause serious injury, including full skin necrosis. Follow these Operating Instructions, and carefully read the Product Insert (see pouch on side of unit) and the Cold Therapy Pad Fitting Instructions (provided with each Cold Therapy Pad) prior to use.

## 2022-06-26 NOTE — Anesthesia Procedure Notes (Signed)
Spinal  Patient location during procedure: OR Start time: 06/26/2022 7:32 AM End time: 06/26/2022 7:40 AM Reason for block: surgical anesthesia Staffing Performed: resident/CRNA  Anesthesiologist: Darrin Nipper, MD Resident/CRNA: Jonna Clark, CRNA Performed by: Jonna Clark, CRNA Authorized by: Darrin Nipper, MD   Preanesthetic Checklist Completed: patient identified, IV checked, site marked, risks and benefits discussed, surgical consent, monitors and equipment checked, pre-op evaluation and timeout performed Spinal Block Patient position: sitting Prep: Betadine Patient monitoring: heart rate, continuous pulse ox and blood pressure Approach: midline Location: L4-5 Injection technique: single-shot Needle Needle type: [place] and Introducer  Needle gauge: 24 G Needle length: 9 cm Assessment Sensory level: T8 Events: CSF return Additional Notes Negative paresthesia. Negative blood return. Positive free-flowing CSF. Expiration date of kit checked and confirmed. Patient tolerated procedure well, without complications.

## 2022-06-26 NOTE — TOC Progression Note (Signed)
Transition of Care [facility]) - Progression Note    Patient Details  Name: Barbara Welch MRN: [id number] Date of Birth: 1966-04-05  Transition [facility] Care [facility]) CM/SW [place], RN Phone Number: 06/26/2022, 10:00 AM  Clinical Narrative:     The patient is set up with [facility] for [place], 3 in 1 and RW to be delivered by [employer]  Expected Discharge Plan: [place] Barriers to Discharge: No Barriers Identified  Expected Discharge Plan and Services         Expected Discharge Date: 06/26/22               DME Arranged: [place] Rile rolling, 3-N-1 DME Agency: [facility] Date DME Agency Contacted: 06/26/22   Representative spoke with at DME Agency: [place]: PT [place]: [place] Date [place]: 06/26/22 Time [place]: [phone] Representative spoke with at [place]: [place]   Social Determinants of Health ([place]) Interventions SDOH Screenings   Tobacco Use: Medium Risk (06/26/2022)    Readmission Risk Interventions     No data to display

## 2022-06-26 NOTE — Transfer of Care (Addendum)
Immediate Anesthesia Transfer of Care Note  Patient: Barbara Welch  Procedure(s) Performed: UNICOMPARTMENTAL KNEE (Left: Knee)  Patient Location: PACU  Anesthesia Type:Spinal  Level of Consciousness: awake, alert , and oriented  Airway & Oxygen Therapy: Patient Spontanous Breathing  Post-op Assessment: Report given to RN and Post -op Vital signs reviewed and stable  Post vital signs: Reviewed and stable  Last Vitals:  Vitals Value Taken Time  BP 130/88 06/26/22 1024  Temp 37 C 06/26/22 1024  Pulse 86 06/26/22 1024  Resp 20 06/26/22 1024  SpO2 96 % 06/26/22 1024    Last Pain:  Vitals:   06/26/22 1024  TempSrc: Temporal  PainSc: 0-No pain         Complications: No notable events documented.

## 2022-06-26 NOTE — TOC Progression Note (Signed)
Transition of Care [facility]) - Progression Note    Patient Details  Name: Barbara Welch MRN: [id number] Date of Birth: 11-26-1965  Transition of Care [facility]) CM/SW [place], RN Phone Number: 06/26/2022, 9:07 AM  Clinical Narrative:     Patient is set up with [facility] for [facility] services       Expected Discharge Plan and Services                                               Social Determinants of Health (SDOH) Interventions SDOH Screenings   Tobacco Use: Medium Risk (06/26/2022)    Readmission Risk Interventions     No data to display

## 2022-06-26 NOTE — Op Note (Signed)
06/26/2022  9:26 AM  Patient:   Barbara Welch  Pre-Op Diagnosis:   Osteoarthritis of medial compartment, left knee.  Post-Op Diagnosis:   Same  Procedure:   Left unicondylar knee arthroplasty.  Surgeon:   Pascal Lux, MD  Assistant:   Cameron Proud, PA-C  Anesthesia:   Spinal  Findings:   As above.  Complications:   None  EBL:   5 cc  Fluids:   400 cc crystalloid  UOP:   None  TT:   75 minutes at 300 mmHg  Drains:   None  Closure:   Staples  Implants:   All-cemented Biomet [place] system with an extra small femoral component, a "AA" sized tibial tray, and a 5 mm meniscal bearing insert.  Brief Clinical Note:   The patient is a 57 year old female with a history of progressively worsening medial sided left knee pain. Her symptoms have progressed despite medications, activity modification, injections, etc. Her history and examination are consistent with degenerative joint disease, primarily involving the medial compartment, as well as a complex degenerative medial meniscal tear confirmed by MRI scan. The patient presents at this time for a left partial knee replacement.  Procedure:   The patient was brought into the operating room. After adequate spinal anesthesia was obtained by the anesthesiologist, the patient was lain in the supine position. The patient was repositioned so that the non-surgical leg was placed in a flexed and abducted position in the yellow fin leg holder while the surgical extremity was placed over the Biomet leg holder. The left lower extremity was prepped with ChloraPrep solution before being draped sterilely. Preoperative antibiotics were administered. After performing a timeout to verify the appropriate surgical site, the limb was exsanguinated with an Esmarch and the tourniquet inflated to 300 mmHg.   A standard anterior approach to the knee was made through an approximately 3.5-4 inch incision. The incision was carried down through the subcutaneous  tissues to expose the superficial retinaculum. This was split the length the incision and the medial flap elevated sufficiently to expose the medial retinaculum. The medial retinaculum was incised along the medial border of the patella tendon and extended proximally along the medial border of the patella, leaving a 3-4 mm cuff of tissue. The soft tissues were elevated off the anteromedial aspect of the proximal tibia. The anterior portion of the meniscus was removed after performing a subtotal excision of the infrapatellar fat pad. The anterior cruciate ligament was inspected and found to be in excellent condition. Osteophytes were removed from the inferior pole of the patella as well as from the notch using a quarter-inch osteotome. There were significant degenerative changes of both the femur and tibia on the medial side.   The medial femoral condyle was sized using the small and extra small sizers. It was felt that the extra small guide best optimized the contour of the femur. This was left in place and the external tibial guide positioned. The coupling device was used to connect the guide to the medial femoral condylar sizer to optimize appropriate orientation. Two guide pins were inserted into the cutting block before the coupling device and sizer were removed. The appropriate tibial cut was made using the oscillating and reciprocating saws. The piece was removed in its entirety and taken to the back table where it was sized and found to be optimally replicated by a "AA" sized component. The 7 mm spacer was inserted to verify that sufficient bone had been removed.  Attention was  directed to femoral side. The intramedullary canal was accessed through a 4 mm drill hole. The intramedullary guide was positioned before the guide for the femoral condylar holes was positioned. The appropriate coupling device connected this guide to the intramedullary guide before both drill holes were created in the distal aspect  of the medial femoral condyle. The devices were removed and the posterior condylar cutting block inserted. The appropriate cut was made using the reciprocating saw and this piece removed. The #0 spigot was inserted and the initial bone milling performed. A trial femoral component was inserted and both the flexion and extension gaps measured. In flexion, the gap measured 7 mm whereas in extension, it measured 3 mm. Therefore, the #4 spigot was selected and the secondary bone milling performed. Repeat sizing demonstrated symmetric flexion and extension gaps. The bone was removed from the postero-medial and postero-lateral aspects of the femoral condyle, as well as from the beneath the collar of the spigot. Bone also was removed from the anterior portion of the femur so as to minimize any potential impingement with the meniscal bearing insert. The trial components removed and several drill holes placed into the distal femoral condyle to further augment cement fixation.  Attention was redirected to the tibial side. The "AA" sized tibial tray was positioned and temporarily secured using the appropriate spiked nail. The keel was created using the bi-bladed reciprocating saw and hoe. The keeled "AA" sized trial tibial tray was inserted to be sure that it seated properly. At this point, a total of 20 cc of Exparel, 2 cc of Kenalog 40 (80 mg), 30 mg of Toradol, and 30 cc of 0.5% Sensorcaine diluted out to 60 cc with normal saline was injected in and around the posterior and medial capsular tissues, as well as the peri-incisional tissues to help with postoperative pain control.  The bony surfaces were prepared for cementing by irrigating them thoroughly with bacitracin saline solution using the jet lavage system before packing them with a dry Ray-Tec sponge. Meanwhile, cement was being mixed on the back table. When the cement was ready, the tibial tray was cemented in first. The excess cement was removed using a Engineering geologist after impacting it into place. Next, the femoral component was impacted into place. Again the excess cement was removed using a Surveyor, quantity. The 5 mm spacer was inserted and the knee brought into near full extension while the cement hardened. Once the cement hardened, the spacer was removed and the 4 mm and 5 mm meniscal bearing inserts were trialed. The 5 mm insert demonstrated excellent tracking while the knee was placed through a range of motion, and showed no evidence towards subluxation or dislocation. In addition, it did not fit too tightly. Therefore, the permanent 5 mm meniscal bearing insert was snapped into position after verifying that no cement had been retained posteriorly. Again the knee was placed through a range of motion with the findings as described above.  The wound was copiously irrigated with sterile saline solution via the jet lavage system before the retinacular layer was reapproximated using #0 Vicryl interrupted sutures. At this point, 1 g of transexemic acid in 10 cc of normal saline was injected intra-articularly. The subcutaneous tissues were closed in two layers using 2-0 Vicryl interrupted sutures before the skin was closed using staples. A sterile occlusive dressing was applied to the knee before the patient was awakened. The patient was transferred back to his/her hospital bed and returned to the recovery room in satisfactory  condition after tolerating the procedure well. A Polar Care device was applied to the knee as well.

## 2022-06-26 NOTE — H&P (Signed)
History of Present Illness: Barbara Welch is a 57 y.o. female who presents today for her history and physical for upcoming left partial knee arthroplasty. Surgery scheduled Dr. Roland Rack on 06/26/2022. The patient denies any changes in her medical history since he was last evaluated. The patient denies any personal history of heart attack, stroke, asthma or COPD. No personal history of blood clots, the patient is not a diabetic. She still reports an 8 out of 10 pain score in the left knee at today's visit, the patient has been taking tramadol as needed for discomfort at this time. The patient reports an 8 out of 10 pain score in the left lower extremity. The patient continues to report moderate pain along the medial aspect the left knee worse with prolonged periods of standing and walking.  Past Medical History:  Allergic state  Anxiety  prior effexor  Depression  prior effexor  Endometriosis  GERD (gastroesophageal reflux disease)  Hyperlipidemia  Hypertension  Insomnia  Osteopenia  Post-traumatic osteoarthritis of left knee 01/29/2022   Past Surgical History: REMOVAL ECTOPIC PREGNANCY W/EVACUATION 1997  HYSTERECTOMY and BSO- complete-endometriosis 1998 KNEE ARTHROSCOPY Left 2011  EXPLORATORY LAPAROTOMY  LAPAROSCOPIC TUBAL LIGATION  TUBAL LIGATION   Past Family History: Anxiety Mother  Colon polyps Mother  Depression Mother  Other Mother  Crohn's disease  Thyroid disease Mother  Colon cancer Mother  Barrett's esophagus Mother  High blood pressure Father  Leukemia Father  Anxiety Father  Coronary Artery Disease Father  Depression Father  Hip fracture Father  Skin cancer Father  Diabetes type II Maternal Grandfather  Colon cancer Maternal Grandmother  Colon polyps Maternal Grandmother  Osteoporosis (Thinning of bones) Neg Hx   Medications: amLODIPine (NORVASC) 10 MG tablet Take 1 tablet (10 mg total) by mouth once daily 90 tablet 3  celecoxib (CELEBREX) 200 MG  capsule Take 1 capsule (200 mg total) by mouth 2 (two) times daily 60 capsule 0  DOCOSAHEXANOIC ACID/EPA (FISH OIL ORAL) Take by mouth.  ezetimibe (ZETIA) 10 mg tablet Take 1 tablet (10 mg total) by mouth once daily 90 tablet 3  rosuvastatin (CRESTOR) 10 MG tablet Take 1 tablet (10 mg total) by mouth once daily 90 tablet 3  traMADoL (ULTRAM) 50 mg tablet Take 1 tablet (50 mg total) by mouth every 8 (eight) hours as needed 21 tablet 0  ubidecarenone (CO-ENZYME Q-10, UBIQUINONE,) 200 mg tablet Take 400 mg by mouth once daily   Allergies: No Known Allergies   Review of Systems:  A comprehensive 14 point ROS was performed, reviewed by me today, and the pertinent orthopaedic findings are documented in the HPI.  Physical Exam: BP 124/86  Ht 162.6 cm ('5\' 4"'$ )  Wt 65.8 kg (145 lb)  BMI 24.89 kg/m  General/Constitutional: The patient appears to be well-nourished, well-developed, and in no acute distress. Neuro/Psych: Normal mood and affect, oriented to person, place and time. Eyes: Non-icteric. Pupils are equal, round, and reactive to light, and exhibit synchronous movement. ENT: Unremarkable. Lymphatic: No palpable adenopathy. Respiratory: Lungs clear to auscultation, Normal chest excursion, No wheezes, and Non-labored breathing Cardiovascular: Regular rate and rhythm. No murmurs. and No edema, swelling or tenderness, except as noted in detailed exam. Integumentary: No impressive skin lesions present, except as noted in detailed exam. Musculoskeletal: Unremarkable, except as noted in detailed exam.  Left knee exam: GAIT: mild limp and uses no assistive devices. ALIGNMENT: mild varus SKIN: Well-healed arthroscopic portal sites, otherwise unremarkable SWELLING: minimal EFFUSION: trace WARMTH: no warmth TENDERNESS: mild over  the medial joint line ROM: 0 to 130 degrees with mild discomfort in maximal flexion McMURRAY'S: equivocal PATELLOFEMORAL: normal tracking with no peri-patellar  tenderness and negative apprehension sign CREPITUS: Trace patellar crepitance LACHMAN'S: negative PIVOT SHIFT: negative ANTERIOR DRAWER: negative POSTERIOR DRAWER: negative VARUS/VALGUS: positive pseudolaxity to varus stressing  She is neurovascularly intact to the left lower extremity and foot.  Knee X-Ray Imaging: AP weightbearing of both knees, as well as lateral and merchant views of the left knee have been obtained in the past. These films demonstrate mild degenerative changes, primarily involving the medial compartment with 20% Medial joint space narrowing. Overall alignment is mild varus. No fractures, lytic lesions, or abnormal calcifications are noted.  MRI Imaging: Left knee: These films demonstrate moderate-severe degenerative changes, limited to the medial compartment with areas of complete loss of the articular cartilage on both the medial edge of the medial femoral condyle and medial portion of the medial tibial plateau. The meniscus itself shows evidence of prior partial meniscectomy with "maceration" of the remaining rim of meniscus. The lateral compartment is in excellent condition as is the patellofemoral compartment. No ligamentous pathology is identified. There is significant bone marrow edema involving both the medial portions of both the medial tibial plateau and medial femoral condyle.  Impression: Post-traumatic osteoarthritis of left knee.  Plan:  1. Treatment options were discussed today with the patient. 2. The patient is scheduled for a left partial knee arthroplasty with Dr. Roland Rack on 06/26/2022. 3. The patient was instructed on the risk and benefits of surgical intervention and wishes to proceed at this time. 4. This document will serve as a surgical history and physical for the patient. 5. The patient will follow-up per send postop protocol at this time.  The procedure was discussed with the patient, as were the potential risks (including bleeding, infection,  nerve and/or blood vessel injury, persistent or recurrent pain, failure of the hardware, need for conversion to total knee arthroplasty, progression of arthritis, need for further surgery, blood clots, strokes, heart attacks and/or arhythmias, pneumonia, etc.) and benefits. The patient states her understanding and wishes to proceed.    H&P reviewed and patient re-examined. No changes.

## 2022-06-26 NOTE — Evaluation (Signed)
Physical Therapy Evaluation Patient Details Name: Barbara Welch MRN: [id number] DOB: Sep 10, 1965 Today's Date: 06/26/2022  History of Present Illness  Pt admitted for L uni-knee. Pt evaluated on POD 0. HIstory includes anxiety, depression, GERD, HLD, and HTN.  Clinical Impression  Pt is a pleasant 57 year old female who was admitted for L uni-knee on POD 0. Pt performs bed mobility with mod I, transfers with cga, and ambulation with cga and RW. Pt demonstrates ability to perform 10 SLRs with independence, therefore does not require KI for mobility. Pt demonstrates deficits with strength/pain/mobility. Would benefit from skilled PT to address above deficits and promote optimal return to PLOF. Recommending return to home with follow up from [facility].      Recommendations for follow up therapy are one component of a multi-disciplinary discharge planning process, led by the attending physician.  Recommendations may be updated based on patient status, additional functional criteria and insurance authorization.  Follow Up Recommendations Home health PT      Assistance Recommended at Discharge PRN  Patient can return home with the following  A little help with walking and/or transfers;Help with stairs or ramp for entrance    Equipment Recommendations Rolling walker (2 wheels)  Recommendations for Other Services       Functional Status Assessment Patient has had a recent decline in their functional status and demonstrates the ability to make significant improvements in function in a reasonable and predictable amount of time.     Precautions / Restrictions Precautions Precautions: Fall;Knee Precaution Booklet Issued: Yes (comment) Restrictions Weight Bearing Restrictions: Yes LLE Weight Bearing: Weight bearing as tolerated      Mobility  Bed Mobility Overal bed mobility: Modified Independent             General bed mobility comments: safe technique with ease of mobility     Transfers Overall transfer level: Needs assistance Equipment used: Rolling walker (2 wheels) Transfers: Sit to/from Stand Sit to Stand: Min guard           General transfer comment: safe technique with upright posture. RW used    Ambulation/Gait Ambulation/Gait assistance: Counsellor (Feet): 150 Feet Assistive device: Rolling walker (2 wheels) Gait Pattern/deviations: Step-through pattern       General Gait Details: ambulated in hallway with reciprocal gait pattern and safe technique. RW used  Stairs Stairs: Yes Stairs assistance: Supervision Stair Management: No rails, Backwards, Step to pattern Number of Stairs: 1 General stair comments: up/down 1 step safely  Wheelchair Mobility    Modified Rankin (Stroke Patients Only)       Balance Overall balance assessment: Needs assistance Sitting-balance support: Feet supported Sitting balance-Leahy Scale: Good     Standing balance support: Bilateral upper extremity supported Standing balance-Leahy Scale: Good                               Pertinent Vitals/Pain Pain Assessment Pain Assessment: 0-10 Pain Score: 6  Pain Location: L knee Pain Descriptors / Indicators: Operative site guarding Pain Intervention(s): Limited activity within patient's tolerance, Repositioned, Premedicated before session, Ice applied    Home Living Family/patient expects to be discharged to:: Private residence Living Arrangements: Spouse/significant other Available Help at Discharge: Family Type of Home: House Home Access: Stairs to enter Entrance Stairs-Rails: None Entrance Stairs-Number of Steps: 1 small step   Home Layout: Multi-level;Able to live on main level with bedroom/bathroom Home Equipment:  (comfort height toilet)  Prior Function Prior Level of Function : Independent/Modified Independent;Working/employed;Driving             Mobility Comments: working and active. No DME prior to  admission ADLs Comments: indep     Hand Dominance        Extremity/Trunk Assessment   Upper Extremity Assessment Upper Extremity Assessment: Overall WFL for tasks assessed    Lower Extremity Assessment Lower Extremity Assessment: Overall WFL for tasks assessed       Communication   Communication: No difficulties  Cognition Arousal/Alertness: Awake/alert Behavior During Therapy: WFL for tasks assessed/performed Overall Cognitive Status: Within Functional Limits for tasks assessed                                          General Comments      Exercises Total Joint Exercises Goniometric ROM: L knee AAROM: 15-62 degrees. Education on knee extension Other Exercises Other Exercises: HEP written and reviewed. Education given on polar care. Other Exercises: ambulated to bathroom with supervision. Used elevated toilet with safe technique   Assessment/Plan    PT Assessment All further PT needs can be met in the next venue of care  PT Problem List Decreased strength;Decreased range of motion;Decreased activity tolerance;Decreased mobility;Decreased knowledge of use of DME;Pain       PT Treatment Interventions      PT Goals (Current goals can be found in the Care Plan section)  Acute Rehab PT Goals Patient Stated Goal: to go home today PT Goal Formulation: All assessment and education complete, DC therapy Time For Goal Achievement: 06/26/22 Potential to Achieve Goals: Good    Frequency       Co-evaluation               AM-PAC PT "6 Clicks" Mobility  Outcome Measure Help needed turning from your back to your side while in a flat bed without using bedrails?: None Help needed moving from lying on your back to sitting on the side of a flat bed without using bedrails?: None Help needed moving to and from a bed to a chair (including a wheelchair)?: None Help needed standing up from a chair using your arms (e.g., wheelchair or bedside chair)?:  None Help needed to walk in hospital room?: None Help needed climbing 3-5 steps with a railing? : None 6 Click Score: 24    End of Session Equipment Utilized During Treatment: Gait belt Activity Tolerance: Patient tolerated treatment well Patient left: in bed Nurse Communication: Mobility status PT Visit Diagnosis: Difficulty in walking, not elsewhere classified (R26.2);Pain Pain - Right/Left: Left Pain - part of body: Knee    Time: [id number] PT Time Calculation (min) (ACUTE ONLY): 20 min   Charges:   PT Evaluation $PT Eval Low Complexity: 1 Low PT Treatments $Gait Training: 8-22 mins        Greggory Stallion, PT, DPT, [facility] [phone]   Delta Deshmukh 06/26/2022, 2:37 PM

## 2022-06-27 ENCOUNTER — Encounter: Payer: Self-pay | Admitting: Surgery

## 2022-06-28 DIAGNOSIS — Z471 Aftercare following joint replacement surgery: Secondary | ICD-10-CM | POA: Diagnosis not present

## 2022-06-28 DIAGNOSIS — F419 Anxiety disorder, unspecified: Secondary | ICD-10-CM | POA: Diagnosis not present

## 2022-06-28 DIAGNOSIS — K219 Gastro-esophageal reflux disease without esophagitis: Secondary | ICD-10-CM | POA: Diagnosis not present

## 2022-06-28 DIAGNOSIS — G4700 Insomnia, unspecified: Secondary | ICD-10-CM | POA: Diagnosis not present

## 2022-06-28 DIAGNOSIS — N809 Endometriosis, unspecified: Secondary | ICD-10-CM | POA: Diagnosis not present

## 2022-06-28 DIAGNOSIS — I10 Essential (primary) hypertension: Secondary | ICD-10-CM | POA: Diagnosis not present

## 2022-06-28 DIAGNOSIS — E785 Hyperlipidemia, unspecified: Secondary | ICD-10-CM | POA: Diagnosis not present

## 2022-06-28 DIAGNOSIS — M8580 Other specified disorders of bone density and structure, unspecified site: Secondary | ICD-10-CM | POA: Diagnosis not present

## 2022-06-28 DIAGNOSIS — Z96652 Presence of left artificial knee joint: Secondary | ICD-10-CM | POA: Diagnosis not present

## 2022-06-28 DIAGNOSIS — F32A Depression, unspecified: Secondary | ICD-10-CM | POA: Diagnosis not present

## 2022-06-29 DIAGNOSIS — Z471 Aftercare following joint replacement surgery: Secondary | ICD-10-CM | POA: Diagnosis not present

## 2022-06-29 NOTE — Anesthesia Postprocedure Evaluation (Signed)
Anesthesia Post Note  Patient: Barbara Welch  Procedure(s) Performed: UNICOMPARTMENTAL KNEE (Left: Knee)  Patient location during evaluation: PACU Anesthesia Type: Spinal Level of consciousness: oriented and awake and alert Pain management: pain level controlled Vital Signs Assessment: post-procedure vital signs reviewed and stable Respiratory status: spontaneous breathing, respiratory function stable and patient connected to nasal cannula oxygen Cardiovascular status: blood pressure returned to baseline and stable Postop Assessment: no headache, no backache and no apparent nausea or vomiting Anesthetic complications: no   No notable events documented.   Last Vitals:  Vitals:   06/26/22 1015 06/26/22 1024  BP: 114/79 130/88  Pulse: 85 86  Resp: 20 20  Temp: 36.7 C 37 C  SpO2: 95% 96%    Last Pain:  Vitals:   06/26/22 1110  TempSrc:   PainSc: [place]

## 2022-06-30 DIAGNOSIS — Z96652 Presence of left artificial knee joint: Secondary | ICD-10-CM | POA: Diagnosis not present

## 2022-06-30 DIAGNOSIS — K219 Gastro-esophageal reflux disease without esophagitis: Secondary | ICD-10-CM | POA: Diagnosis not present

## 2022-06-30 DIAGNOSIS — Z471 Aftercare following joint replacement surgery: Secondary | ICD-10-CM | POA: Diagnosis not present

## 2022-06-30 DIAGNOSIS — M8580 Other specified disorders of bone density and structure, unspecified site: Secondary | ICD-10-CM | POA: Diagnosis not present

## 2022-06-30 DIAGNOSIS — E785 Hyperlipidemia, unspecified: Secondary | ICD-10-CM | POA: Diagnosis not present

## 2022-06-30 DIAGNOSIS — N809 Endometriosis, unspecified: Secondary | ICD-10-CM | POA: Diagnosis not present

## 2022-06-30 DIAGNOSIS — I10 Essential (primary) hypertension: Secondary | ICD-10-CM | POA: Diagnosis not present

## 2022-06-30 DIAGNOSIS — G4700 Insomnia, unspecified: Secondary | ICD-10-CM | POA: Diagnosis not present

## 2022-06-30 DIAGNOSIS — F419 Anxiety disorder, unspecified: Secondary | ICD-10-CM | POA: Diagnosis not present

## 2022-06-30 DIAGNOSIS — F32A Depression, unspecified: Secondary | ICD-10-CM | POA: Diagnosis not present

## 2022-07-02 DIAGNOSIS — K219 Gastro-esophageal reflux disease without esophagitis: Secondary | ICD-10-CM | POA: Diagnosis not present

## 2022-07-02 DIAGNOSIS — N809 Endometriosis, unspecified: Secondary | ICD-10-CM | POA: Diagnosis not present

## 2022-07-02 DIAGNOSIS — Z471 Aftercare following joint replacement surgery: Secondary | ICD-10-CM | POA: Diagnosis not present

## 2022-07-02 DIAGNOSIS — Z96652 Presence of left artificial knee joint: Secondary | ICD-10-CM | POA: Diagnosis not present

## 2022-07-02 DIAGNOSIS — I10 Essential (primary) hypertension: Secondary | ICD-10-CM | POA: Diagnosis not present

## 2022-07-02 DIAGNOSIS — G4700 Insomnia, unspecified: Secondary | ICD-10-CM | POA: Diagnosis not present

## 2022-07-02 DIAGNOSIS — F419 Anxiety disorder, unspecified: Secondary | ICD-10-CM | POA: Diagnosis not present

## 2022-07-02 DIAGNOSIS — E785 Hyperlipidemia, unspecified: Secondary | ICD-10-CM | POA: Diagnosis not present

## 2022-07-02 DIAGNOSIS — F32A Depression, unspecified: Secondary | ICD-10-CM | POA: Diagnosis not present

## 2022-07-02 DIAGNOSIS — M8580 Other specified disorders of bone density and structure, unspecified site: Secondary | ICD-10-CM | POA: Diagnosis not present

## 2022-07-04 DIAGNOSIS — K219 Gastro-esophageal reflux disease without esophagitis: Secondary | ICD-10-CM | POA: Diagnosis not present

## 2022-07-04 DIAGNOSIS — G4700 Insomnia, unspecified: Secondary | ICD-10-CM | POA: Diagnosis not present

## 2022-07-04 DIAGNOSIS — E785 Hyperlipidemia, unspecified: Secondary | ICD-10-CM | POA: Diagnosis not present

## 2022-07-04 DIAGNOSIS — I10 Essential (primary) hypertension: Secondary | ICD-10-CM | POA: Diagnosis not present

## 2022-07-04 DIAGNOSIS — M8580 Other specified disorders of bone density and structure, unspecified site: Secondary | ICD-10-CM | POA: Diagnosis not present

## 2022-07-04 DIAGNOSIS — N809 Endometriosis, unspecified: Secondary | ICD-10-CM | POA: Diagnosis not present

## 2022-07-04 DIAGNOSIS — Z471 Aftercare following joint replacement surgery: Secondary | ICD-10-CM | POA: Diagnosis not present

## 2022-07-04 DIAGNOSIS — Z96652 Presence of left artificial knee joint: Secondary | ICD-10-CM | POA: Diagnosis not present

## 2022-07-04 DIAGNOSIS — F32A Depression, unspecified: Secondary | ICD-10-CM | POA: Diagnosis not present

## 2022-07-04 DIAGNOSIS — F419 Anxiety disorder, unspecified: Secondary | ICD-10-CM | POA: Diagnosis not present

## 2022-07-06 DIAGNOSIS — F419 Anxiety disorder, unspecified: Secondary | ICD-10-CM | POA: Diagnosis not present

## 2022-07-06 DIAGNOSIS — G4700 Insomnia, unspecified: Secondary | ICD-10-CM | POA: Diagnosis not present

## 2022-07-06 DIAGNOSIS — K219 Gastro-esophageal reflux disease without esophagitis: Secondary | ICD-10-CM | POA: Diagnosis not present

## 2022-07-06 DIAGNOSIS — E785 Hyperlipidemia, unspecified: Secondary | ICD-10-CM | POA: Diagnosis not present

## 2022-07-06 DIAGNOSIS — F32A Depression, unspecified: Secondary | ICD-10-CM | POA: Diagnosis not present

## 2022-07-06 DIAGNOSIS — Z96652 Presence of left artificial knee joint: Secondary | ICD-10-CM | POA: Diagnosis not present

## 2022-07-06 DIAGNOSIS — N809 Endometriosis, unspecified: Secondary | ICD-10-CM | POA: Diagnosis not present

## 2022-07-06 DIAGNOSIS — M8580 Other specified disorders of bone density and structure, unspecified site: Secondary | ICD-10-CM | POA: Diagnosis not present

## 2022-07-06 DIAGNOSIS — Z471 Aftercare following joint replacement surgery: Secondary | ICD-10-CM | POA: Diagnosis not present

## 2022-07-06 DIAGNOSIS — I10 Essential (primary) hypertension: Secondary | ICD-10-CM | POA: Diagnosis not present

## 2022-07-09 DIAGNOSIS — M8580 Other specified disorders of bone density and structure, unspecified site: Secondary | ICD-10-CM | POA: Diagnosis not present

## 2022-07-09 DIAGNOSIS — E785 Hyperlipidemia, unspecified: Secondary | ICD-10-CM | POA: Diagnosis not present

## 2022-07-09 DIAGNOSIS — F419 Anxiety disorder, unspecified: Secondary | ICD-10-CM | POA: Diagnosis not present

## 2022-07-09 DIAGNOSIS — N809 Endometriosis, unspecified: Secondary | ICD-10-CM | POA: Diagnosis not present

## 2022-07-09 DIAGNOSIS — F32A Depression, unspecified: Secondary | ICD-10-CM | POA: Diagnosis not present

## 2022-07-09 DIAGNOSIS — Z471 Aftercare following joint replacement surgery: Secondary | ICD-10-CM | POA: Diagnosis not present

## 2022-07-09 DIAGNOSIS — Z96652 Presence of left artificial knee joint: Secondary | ICD-10-CM | POA: Diagnosis not present

## 2022-07-09 DIAGNOSIS — G4700 Insomnia, unspecified: Secondary | ICD-10-CM | POA: Diagnosis not present

## 2022-07-09 DIAGNOSIS — K219 Gastro-esophageal reflux disease without esophagitis: Secondary | ICD-10-CM | POA: Diagnosis not present

## 2022-07-09 DIAGNOSIS — I10 Essential (primary) hypertension: Secondary | ICD-10-CM | POA: Diagnosis not present

## 2022-07-11 ENCOUNTER — Other Ambulatory Visit: Payer: Self-pay

## 2022-07-11 DIAGNOSIS — Z96652 Presence of left artificial knee joint: Secondary | ICD-10-CM | POA: Diagnosis not present

## 2022-07-11 MED ORDER — HYDROCODONE-ACETAMINOPHEN 5-325 MG PO TABS
1.0000 | ORAL_TABLET | Freq: Three times a day (TID) | ORAL | 0 refills | Status: DC | PRN
  Filled 2022-07-11: qty 21, 7d supply, fill #0

## 2022-07-16 DIAGNOSIS — Z96652 Presence of left artificial knee joint: Secondary | ICD-10-CM | POA: Diagnosis not present

## 2022-07-18 DIAGNOSIS — Z96652 Presence of left artificial knee joint: Secondary | ICD-10-CM | POA: Diagnosis not present

## 2022-07-20 DIAGNOSIS — Z96652 Presence of left artificial knee joint: Secondary | ICD-10-CM | POA: Diagnosis not present

## 2022-07-23 DIAGNOSIS — Z96652 Presence of left artificial knee joint: Secondary | ICD-10-CM | POA: Diagnosis not present

## 2022-07-27 DIAGNOSIS — Z96652 Presence of left artificial knee joint: Secondary | ICD-10-CM | POA: Diagnosis not present

## 2022-08-01 DIAGNOSIS — Z96652 Presence of left artificial knee joint: Secondary | ICD-10-CM | POA: Diagnosis not present

## 2022-08-06 ENCOUNTER — Other Ambulatory Visit: Payer: Self-pay

## 2022-08-06 DIAGNOSIS — M1732 Unilateral post-traumatic osteoarthritis, left knee: Secondary | ICD-10-CM | POA: Diagnosis not present

## 2022-08-06 DIAGNOSIS — Z96652 Presence of left artificial knee joint: Secondary | ICD-10-CM | POA: Diagnosis not present

## 2022-08-06 MED ORDER — TRAMADOL HCL 50 MG PO TABS
50.0000 mg | ORAL_TABLET | Freq: Two times a day (BID) | ORAL | 0 refills | Status: DC | PRN
  Filled 2022-08-06: qty 20, 10d supply, fill #0

## 2022-08-14 ENCOUNTER — Other Ambulatory Visit: Payer: Self-pay | Admitting: Physician Assistant

## 2022-08-14 DIAGNOSIS — Z1231 Encounter for screening mammogram for malignant neoplasm of breast: Secondary | ICD-10-CM

## 2022-08-15 ENCOUNTER — Ambulatory Visit
Admission: RE | Admit: 2022-08-15 | Discharge: 2022-08-15 | Disposition: A | Discharge status: Home / Self Care | Payer: Commercial Managed Care - PPO | Source: Ambulatory Visit | Attending: Physician Assistant | Admitting: Physician Assistant

## 2022-08-15 DIAGNOSIS — Z1231 Encounter for screening mammogram for malignant neoplasm of breast: Secondary | ICD-10-CM

## 2022-09-05 ENCOUNTER — Other Ambulatory Visit: Payer: Self-pay

## 2022-09-05 DIAGNOSIS — Z1331 Encounter for screening for depression: Secondary | ICD-10-CM | POA: Diagnosis not present

## 2022-09-05 DIAGNOSIS — R102 Pelvic and perineal pain: Secondary | ICD-10-CM | POA: Diagnosis not present

## 2022-09-05 DIAGNOSIS — B379 Candidiasis, unspecified: Secondary | ICD-10-CM | POA: Diagnosis not present

## 2022-09-05 DIAGNOSIS — R7989 Other specified abnormal findings of blood chemistry: Secondary | ICD-10-CM | POA: Diagnosis not present

## 2022-09-05 DIAGNOSIS — Z0000 Encounter for general adult medical examination without abnormal findings: Secondary | ICD-10-CM | POA: Diagnosis not present

## 2022-09-05 DIAGNOSIS — Z131 Encounter for screening for diabetes mellitus: Secondary | ICD-10-CM | POA: Diagnosis not present

## 2022-09-05 DIAGNOSIS — T3695XA Adverse effect of unspecified systemic antibiotic, initial encounter: Secondary | ICD-10-CM | POA: Diagnosis not present

## 2022-09-05 DIAGNOSIS — N3090 Cystitis, unspecified without hematuria: Secondary | ICD-10-CM | POA: Diagnosis not present

## 2022-09-05 DIAGNOSIS — E7800 Pure hypercholesterolemia, unspecified: Secondary | ICD-10-CM | POA: Diagnosis not present

## 2022-09-05 DIAGNOSIS — I10 Essential (primary) hypertension: Secondary | ICD-10-CM | POA: Diagnosis not present

## 2022-09-05 MED ORDER — FLUCONAZOLE 150 MG PO TABS
150.0000 mg | ORAL_TABLET | Freq: Once | ORAL | 0 refills | Status: AC
  Filled 2022-09-05: qty 2, 2d supply, fill #0

## 2022-09-05 MED ORDER — AMLODIPINE BESYLATE 10 MG PO TABS
10.0000 mg | ORAL_TABLET | Freq: Every day | ORAL | 3 refills | Status: DC
  Filled 2022-09-05 – 2022-10-14 (×2): qty 90, 90d supply, fill #0
  Filled 2023-01-12: qty 90, 90d supply, fill #1
  Filled 2023-04-07: qty 90, 90d supply, fill #2
  Filled 2023-07-14: qty 90, 90d supply, fill #3

## 2022-09-05 MED ORDER — NITROFURANTOIN MONOHYD MACRO 100 MG PO CAPS
100.0000 mg | ORAL_CAPSULE | Freq: Two times a day (BID) | ORAL | 0 refills | Status: DC
  Filled 2022-09-05: qty 14, 7d supply, fill #0

## 2022-09-05 MED ORDER — EZETIMIBE 10 MG PO TABS
10.0000 mg | ORAL_TABLET | Freq: Every day | ORAL | 3 refills | Status: DC
  Filled 2022-09-05 – 2022-10-14 (×2): qty 90, 90d supply, fill #0
  Filled 2023-01-12: qty 90, 90d supply, fill #1
  Filled 2023-04-07: qty 90, 90d supply, fill #2
  Filled 2023-07-14: qty 90, 90d supply, fill #3

## 2022-09-05 MED ORDER — ROSUVASTATIN CALCIUM 10 MG PO TABS
10.0000 mg | ORAL_TABLET | Freq: Every day | ORAL | 3 refills | Status: DC
  Filled 2022-09-05 – 2022-10-14 (×2): qty 90, 90d supply, fill #0
  Filled 2023-01-12: qty 90, 90d supply, fill #1
  Filled 2023-04-07: qty 90, 90d supply, fill #2
  Filled 2023-07-14: qty 90, 90d supply, fill #3

## 2022-09-17 ENCOUNTER — Other Ambulatory Visit: Payer: Self-pay

## 2022-10-14 ENCOUNTER — Other Ambulatory Visit: Payer: Self-pay

## 2022-12-13 ENCOUNTER — Other Ambulatory Visit: Payer: Self-pay

## 2022-12-13 MED ORDER — AMOXICILLIN 500 MG PO CAPS
2000.0000 mg | ORAL_CAPSULE | Freq: Once | ORAL | 0 refills | Status: AC
  Filled 2022-12-13: qty 4, 1d supply, fill #0

## 2022-12-14 ENCOUNTER — Other Ambulatory Visit: Payer: Self-pay

## 2022-12-24 DIAGNOSIS — M1732 Unilateral post-traumatic osteoarthritis, left knee: Secondary | ICD-10-CM | POA: Diagnosis not present

## 2022-12-24 DIAGNOSIS — Z96652 Presence of left artificial knee joint: Secondary | ICD-10-CM | POA: Diagnosis not present

## 2023-02-01 DIAGNOSIS — H5203 Hypermetropia, bilateral: Secondary | ICD-10-CM | POA: Diagnosis not present

## 2023-06-17 ENCOUNTER — Other Ambulatory Visit: Payer: Self-pay

## 2023-06-17 ENCOUNTER — Telehealth: Admitting: Physician Assistant

## 2023-06-17 DIAGNOSIS — J069 Acute upper respiratory infection, unspecified: Secondary | ICD-10-CM

## 2023-06-17 MED ORDER — ALBUTEROL SULFATE HFA 108 (90 BASE) MCG/ACT IN AERS
1.0000 | INHALATION_SPRAY | Freq: Four times a day (QID) | RESPIRATORY_TRACT | 0 refills | Status: AC | PRN
Start: 2023-06-17 — End: ?
  Filled 2023-06-17: qty 6.7, 25d supply, fill #0

## 2023-06-17 MED ORDER — BENZONATATE 100 MG PO CAPS
100.0000 mg | ORAL_CAPSULE | Freq: Three times a day (TID) | ORAL | 0 refills | Status: DC | PRN
Start: 2023-06-17 — End: 2023-10-08
  Filled 2023-06-17: qty 30, 5d supply, fill #0

## 2023-06-17 MED ORDER — FLUTICASONE PROPIONATE 50 MCG/ACT NA SUSP
2.0000 | Freq: Every day | NASAL | 0 refills | Status: AC
Start: 2023-06-17 — End: ?
  Filled 2023-06-17: qty 16, 30d supply, fill #0

## 2023-06-17 NOTE — Progress Notes (Signed)
 E-Visit for Upper Respiratory Infection   We are sorry you are not feeling well.  Here is how we plan to help!  Based on what you have shared with me, it looks like you may have a viral upper respiratory infection.  Upper respiratory infections are caused by a large number of viruses; however, rhinovirus is the most common cause.   Symptoms vary from person to person, with common symptoms including sore throat, cough, fatigue or lack of energy and feeling of general discomfort.  A low-grade fever of up to 100.4 may present, but is often uncommon.  Symptoms vary however, and are closely related to a person's age or underlying illnesses.  The most common symptoms associated with an upper respiratory infection are nasal discharge or congestion, cough, sneezing, headache and pressure in the ears and face.  These symptoms usually persist for about 3 to 10 days, but can last up to 2 weeks.  It is important to know that upper respiratory infections do not cause serious illness or complications in most cases.    Upper respiratory infections can be transmitted from person to person, with the most common method of transmission being a person's hands.  The virus is able to live on the skin and can infect other persons for up to 2 hours after direct contact.  Also, these can be transmitted when someone coughs or sneezes; thus, it is important to cover the mouth to reduce this risk.  To keep the spread of the illness at bay, good hand hygiene is very important.  This is an infection that is most likely caused by a virus. There are no specific treatments other than to help you with the symptoms until the infection runs its course.  We are sorry you are not feeling well.  Here is how we plan to help!   For nasal congestion, you may use an oral decongestants such as Mucinex D or if you have glaucoma or high blood pressure use plain Mucinex.  Saline nasal spray or nasal drops can help and can safely be used as often as  needed for congestion.  For your congestion, I have prescribed Fluticasone nasal spray one spray in each nostril twice a day  If you do not have a history of heart disease, hypertension, diabetes or thyroid disease, prostate/bladder issues or glaucoma, you may also use Sudafed to treat nasal congestion.  It is highly recommended that you consult with a pharmacist or your primary care physician to ensure this medication is safe for you to take.     If you have a cough, you may use cough suppressants such as Delsym and Robitussin.  If you have glaucoma or high blood pressure, you can also use Coricidin HBP.   For cough I have prescribed for you A prescription cough medication called Tessalon Perles 100 mg. You may take 1-2 capsules every 8 hours as needed for cough  I have also prescribed Albuterol inhaler Use 1-2 puffs every 6 hours as needed for shortness of breath, chest tightness, and/or wheezing.  If you have a sore or scratchy throat, use a saltwater gargle-  to  teaspoon of salt dissolved in a 4-ounce to 8-ounce glass of warm water.  Gargle the solution for approximately 15-30 seconds and then spit.  It is important not to swallow the solution.  You can also use throat lozenges/cough drops and Chloraseptic spray to help with throat pain or discomfort.  Warm or cold liquids can also be helpful in  relieving throat pain.  For headache, pain or general discomfort, you can use Ibuprofen or Tylenol as directed.   Some authorities believe that zinc sprays or the use of Echinacea may shorten the course of your symptoms.   HOME CARE Only take medications as instructed by your medical team. Be sure to drink plenty of fluids. Water is fine as well as fruit juices, sodas and electrolyte beverages. You may want to stay away from caffeine or alcohol. If you are nauseated, try taking small sips of liquids. How do you know if you are getting enough fluid? Your urine should be a pale yellow or almost  colorless. Get rest. Taking a steamy shower or using a humidifier may help nasal congestion and ease sore throat pain. You can place a towel over your head and breathe in the steam from hot water coming from a faucet. Using a saline nasal spray works much the same way. Cough drops, hard candies and sore throat lozenges may ease your cough. Avoid close contacts especially the very young and the elderly Cover your mouth if you cough or sneeze Always remember to wash your hands.   GET HELP RIGHT AWAY IF: You develop worsening fever. If your symptoms do not improve within 10 days You develop yellow or green discharge from your nose over 3 days. You have coughing fits You develop a severe head ache or visual changes. You develop shortness of breath, difficulty breathing or start having chest pain Your symptoms persist after you have completed your treatment plan  MAKE SURE YOU  Understand these instructions. Will watch your condition. Will get help right away if you are not doing well or get worse.  Thank you for choosing an e-visit.  Your e-visit answers were reviewed by a board certified advanced clinical practitioner to complete your personal care plan. Depending upon the condition, your plan could have included both over the counter or prescription medications.  Please review your pharmacy choice. Make sure the pharmacy is open so you can pick up prescription now. If there is a problem, you may contact your provider through [employer] and have the prescription routed to another pharmacy.  Your safety is important to [place]. If you have drug allergies check your prescription carefully.   For the next 24 hours you can use MyChart to ask questions about today's visit, request a non-urgent call back, or ask for a work or school excuse. You will get an email in the next two days asking about your experience. I hope that your e-visit has been valuable and will speed your  recovery.     I have spent 5 minutes in review of e-visit questionnaire, review and updating patient chart, medical decision making and response to patient.   Margaretann Loveless, PA-C

## 2023-06-21 ENCOUNTER — Other Ambulatory Visit: Payer: Self-pay

## 2023-06-21 DIAGNOSIS — Z20822 Contact with and (suspected) exposure to covid-19: Secondary | ICD-10-CM | POA: Diagnosis not present

## 2023-06-21 DIAGNOSIS — J209 Acute bronchitis, unspecified: Secondary | ICD-10-CM | POA: Diagnosis not present

## 2023-06-21 DIAGNOSIS — R509 Fever, unspecified: Secondary | ICD-10-CM | POA: Diagnosis not present

## 2023-06-21 MED ORDER — AZITHROMYCIN 250 MG PO TABS
ORAL_TABLET | ORAL | 0 refills | Status: AC
  Filled 2023-06-21: qty 6, 5d supply, fill #0

## 2023-06-21 MED ORDER — PSEUDOEPH-BROMPHEN-DM 30-2-10 MG/5ML PO SYRP
10.0000 mL | ORAL_SOLUTION | ORAL | 0 refills | Status: AC
  Filled 2023-06-21: qty 72, 2d supply, fill #0
  Filled 2023-06-21: qty 228, 3d supply, fill #0

## 2023-06-27 ENCOUNTER — Other Ambulatory Visit: Payer: Self-pay

## 2023-06-27 MED ORDER — AMOXICILLIN 500 MG PO CAPS
ORAL_CAPSULE | ORAL | 4 refills | Status: AC
  Filled 2023-06-27: qty 4, 1d supply, fill #0
  Filled 2023-10-18: qty 4, 1d supply, fill #1
  Filled 2024-01-01: qty 4, 1d supply, fill #2

## 2023-06-29 DIAGNOSIS — J069 Acute upper respiratory infection, unspecified: Secondary | ICD-10-CM | POA: Diagnosis not present

## 2023-07-08 DIAGNOSIS — R059 Cough, unspecified: Secondary | ICD-10-CM | POA: Diagnosis not present

## 2023-09-18 ENCOUNTER — Other Ambulatory Visit: Payer: Self-pay

## 2023-09-18 ENCOUNTER — Other Ambulatory Visit: Payer: Self-pay | Admitting: Physician Assistant

## 2023-09-18 DIAGNOSIS — F419 Anxiety disorder, unspecified: Secondary | ICD-10-CM | POA: Diagnosis not present

## 2023-09-18 DIAGNOSIS — I10 Essential (primary) hypertension: Secondary | ICD-10-CM | POA: Diagnosis not present

## 2023-09-18 DIAGNOSIS — R7989 Other specified abnormal findings of blood chemistry: Secondary | ICD-10-CM | POA: Diagnosis not present

## 2023-09-18 DIAGNOSIS — Z1330 Encounter for screening examination for mental health and behavioral disorders, unspecified: Secondary | ICD-10-CM | POA: Diagnosis not present

## 2023-09-18 DIAGNOSIS — Z1231 Encounter for screening mammogram for malignant neoplasm of breast: Secondary | ICD-10-CM

## 2023-09-18 DIAGNOSIS — K7581 Nonalcoholic steatohepatitis (NASH): Secondary | ICD-10-CM | POA: Diagnosis not present

## 2023-09-18 DIAGNOSIS — Z0000 Encounter for general adult medical examination without abnormal findings: Secondary | ICD-10-CM | POA: Diagnosis not present

## 2023-09-18 DIAGNOSIS — Z1331 Encounter for screening for depression: Secondary | ICD-10-CM | POA: Diagnosis not present

## 2023-09-18 DIAGNOSIS — Z131 Encounter for screening for diabetes mellitus: Secondary | ICD-10-CM | POA: Diagnosis not present

## 2023-09-18 DIAGNOSIS — Z1211 Encounter for screening for malignant neoplasm of colon: Secondary | ICD-10-CM | POA: Diagnosis not present

## 2023-09-18 DIAGNOSIS — E7800 Pure hypercholesterolemia, unspecified: Secondary | ICD-10-CM | POA: Diagnosis not present

## 2023-09-18 MED ORDER — ATORVASTATIN CALCIUM 80 MG PO TABS
80.0000 mg | ORAL_TABLET | Freq: Every day | ORAL | 3 refills | Status: DC
  Filled 2023-09-18: qty 90, 90d supply, fill #0

## 2023-09-23 ENCOUNTER — Other Ambulatory Visit: Payer: Self-pay

## 2023-09-23 MED ORDER — ROSUVASTATIN CALCIUM 10 MG PO TABS
10.0000 mg | ORAL_TABLET | Freq: Every day | ORAL | 3 refills | Status: AC
  Filled 2023-09-23 – 2023-09-24 (×2): qty 90, 90d supply, fill #0
  Filled 2024-01-12: qty 90, 90d supply, fill #1
  Filled 2024-04-06: qty 90, 90d supply, fill #2

## 2023-09-24 ENCOUNTER — Other Ambulatory Visit: Payer: Self-pay

## 2023-10-01 ENCOUNTER — Ambulatory Visit
Admission: RE | Admit: 2023-10-01 | Discharge: 2023-10-01 | Disposition: A | Discharge status: Home / Self Care | Source: Ambulatory Visit | Attending: Physician Assistant | Admitting: Physician Assistant

## 2023-10-01 DIAGNOSIS — Z1231 Encounter for screening mammogram for malignant neoplasm of breast: Secondary | ICD-10-CM | POA: Insufficient documentation

## 2023-10-06 ENCOUNTER — Other Ambulatory Visit: Payer: Self-pay

## 2023-10-07 ENCOUNTER — Other Ambulatory Visit: Payer: Self-pay

## 2023-10-08 ENCOUNTER — Other Ambulatory Visit: Payer: Self-pay

## 2023-10-08 ENCOUNTER — Telehealth: Admitting: Family Medicine

## 2023-10-08 DIAGNOSIS — J069 Acute upper respiratory infection, unspecified: Secondary | ICD-10-CM

## 2023-10-08 DIAGNOSIS — Z1211 Encounter for screening for malignant neoplasm of colon: Secondary | ICD-10-CM | POA: Diagnosis not present

## 2023-10-08 DIAGNOSIS — Z1212 Encounter for screening for malignant neoplasm of rectum: Secondary | ICD-10-CM | POA: Diagnosis not present

## 2023-10-08 MED ORDER — BENZONATATE 100 MG PO CAPS
100.0000 mg | ORAL_CAPSULE | Freq: Three times a day (TID) | ORAL | 0 refills | Status: AC | PRN
  Filled 2023-10-08: qty 30, 10d supply, fill #0

## 2023-10-08 NOTE — Progress Notes (Signed)
 I have spent 5 minutes in review of e-visit questionnaire, review and updating patient chart, medical decision making and response to patient.   Piedad Climes, PA-C

## 2023-10-08 NOTE — Progress Notes (Signed)
 E-Visit for Cough   We are sorry that you are not feeling well.  Here is how we plan to help!  Based on your presentation I believe you most likely have A cough due to a virus.  This is called viral bronchitis and is best treated by rest, plenty of fluids and control of the cough.  You may use Ibuprofen or Tylenol as directed to help your symptoms.     In addition you may use A prescription cough medication called Tessalon Perles 100mg . You may take 1-2 capsules every 8 hours as needed for your cough.  Providers prescribe antibiotics to treat infections caused by bacteria. Antibiotics are very powerful in treating bacterial infections when they are used properly. To maintain their effectiveness, they should be used only when necessary. Overuse of antibiotics has resulted in the development of superbugs that are resistant to treatment!    After careful review of your answers, I would not recommend an antibiotic for your condition.  Antibiotics are not effective against viruses and therefore should not be used to treat them. Common examples of infections caused by viruses include colds and flu   From your responses in the eVisit questionnaire you describe inflammation in the upper respiratory tract which is causing a significant cough.  This is commonly called Bronchitis and has four common causes:   Allergies Viral Infections Acid Reflux Bacterial Infection Allergies, viruses and acid reflux are treated by controlling symptoms or eliminating the cause. An example might be a cough caused by taking certain blood pressure medications. You stop the cough by changing the medication. Another example might be a cough caused by acid reflux. Controlling the reflux helps control the cough.  USE OF BRONCHODILATOR ("RESCUE") INHALERS: There is a risk from using your bronchodilator too frequently.  The risk is that over-reliance on a medication which only relaxes the muscles surrounding the breathing tubes  can reduce the effectiveness of medications prescribed to reduce swelling and congestion of the tubes themselves.  Although you feel brief relief from the bronchodilator inhaler, your asthma may actually be worsening with the tubes becoming more swollen and filled with mucus.  This can delay other crucial treatments, such as oral steroid medications. If you need to use a bronchodilator inhaler daily, several times per day, you should discuss this with your provider.  There are probably better treatments that could be used to keep your asthma under control.     HOME CARE Only take medications as instructed by your medical team. Complete the entire course of an antibiotic. Drink plenty of fluids and get plenty of rest. Avoid close contacts especially the very young and the elderly Cover your mouth if you cough or cough into your sleeve. Always remember to wash your hands A steam or ultrasonic humidifier can help congestion.   GET HELP RIGHT AWAY IF: You develop worsening fever. You become short of breath You cough up blood. Your symptoms persist after you have completed your treatment plan MAKE SURE YOU  Understand these instructions. Will watch your condition. Will get help right away if you are not doing well or get worse.    Thank you for choosing an e-visit.  Your e-visit answers were reviewed by a board certified advanced clinical practitioner to complete your personal care plan. Depending upon the condition, your plan could have included both over the counter or prescription medications.  Please review your pharmacy choice. Make sure the pharmacy is open so you can pick up prescription now.  If there is a problem, you may contact your provider through [employer] and have the prescription routed to another pharmacy.  Your safety is important to [place]. If you have drug allergies check your prescription carefully.   For the next 24 hours you can use MyChart to ask questions about  today's visit, request a non-urgent call back, or ask for a work or school excuse. You will get an email in the next two days asking about your experience. I hope that your e-visit has been valuable and will speed your recovery.

## 2023-10-09 ENCOUNTER — Other Ambulatory Visit: Payer: Self-pay

## 2023-10-10 ENCOUNTER — Other Ambulatory Visit: Payer: Self-pay

## 2023-10-10 MED ORDER — EZETIMIBE 10 MG PO TABS
10.0000 mg | ORAL_TABLET | Freq: Every day | ORAL | 3 refills | Status: AC
  Filled 2023-10-10: qty 90, 90d supply, fill #0
  Filled 2024-01-12: qty 90, 90d supply, fill #1
  Filled 2024-04-06: qty 90, 90d supply, fill #2

## 2023-10-10 MED ORDER — AMLODIPINE BESYLATE 10 MG PO TABS
10.0000 mg | ORAL_TABLET | Freq: Every day | ORAL | 3 refills | Status: AC
  Filled 2023-10-10: qty 90, 90d supply, fill #0
  Filled 2024-01-12: qty 90, 90d supply, fill #1
  Filled 2024-04-06: qty 90, 90d supply, fill #2

## 2023-11-07 IMAGING — MG MM DIGITAL SCREENING BILAT W/ TOMO AND CAD
8 series · 8 of 24 positions shown · non-contrast
Comparison: Previous exam(s).

CLINICAL DATA: Screening.

EXAM:
DIGITAL SCREENING BILATERAL MAMMOGRAM WITH TOMOSYNTHESIS AND CAD
TECHNIQUE: Bilateral screening digital craniocaudal and mediolateral oblique
mammograms were obtained. Bilateral screening digital breast
tomosynthesis was performed. The images were evaluated with
computer-aided detection.

[R MLO synth-2D]
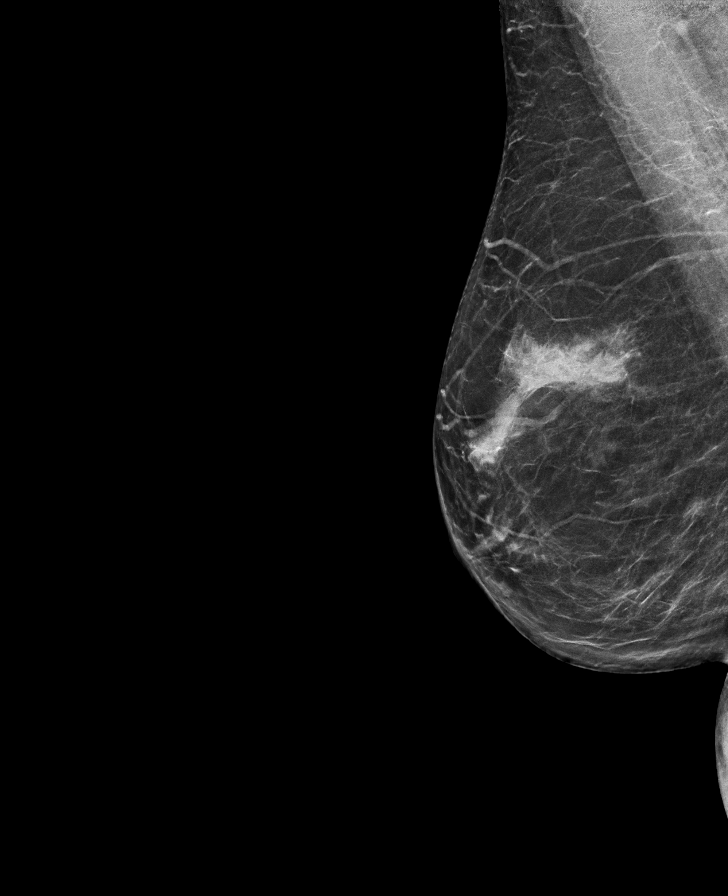

[L MLO synth-2D]
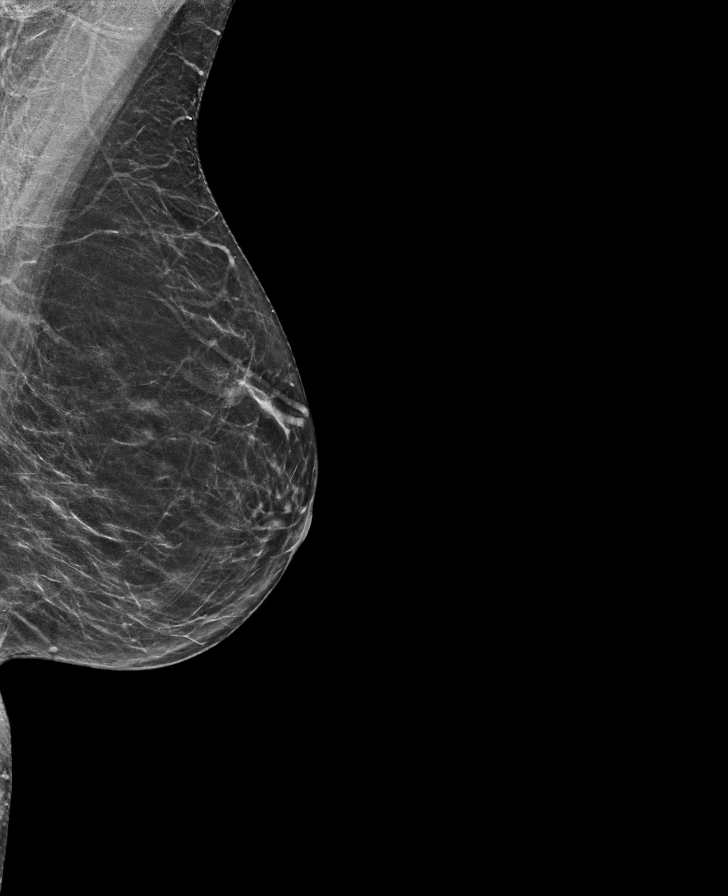

[L CC synth-2D]
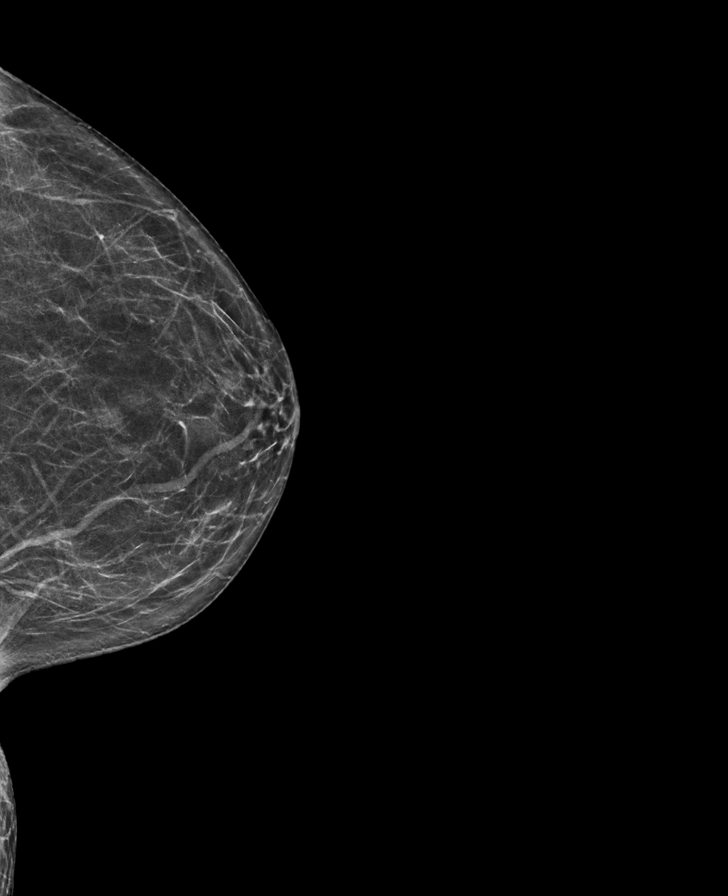

[R CC synth-2D]
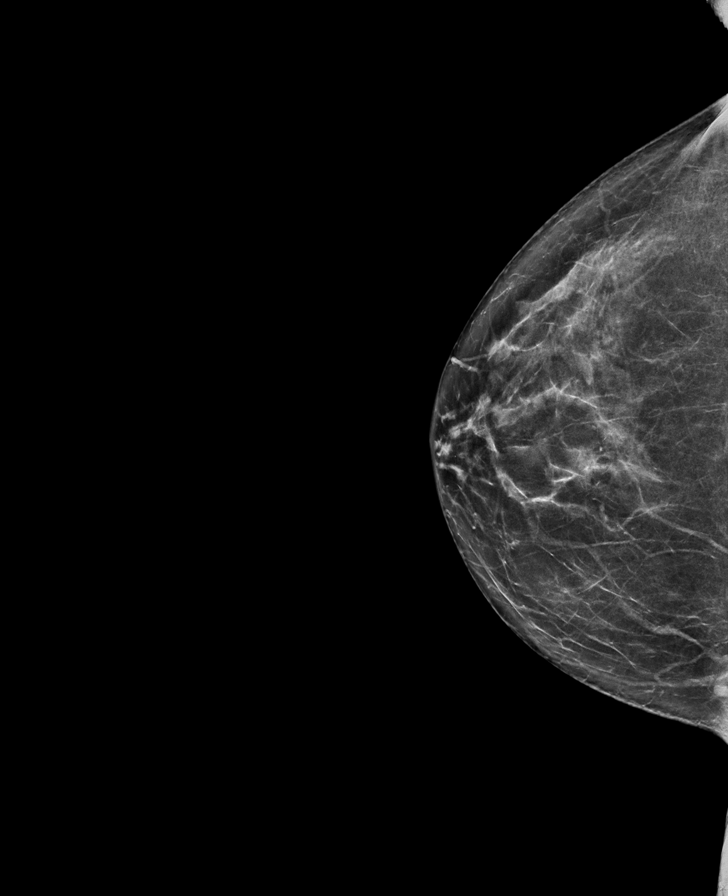

[R MLO tomo · tomo slice 34/67.0]
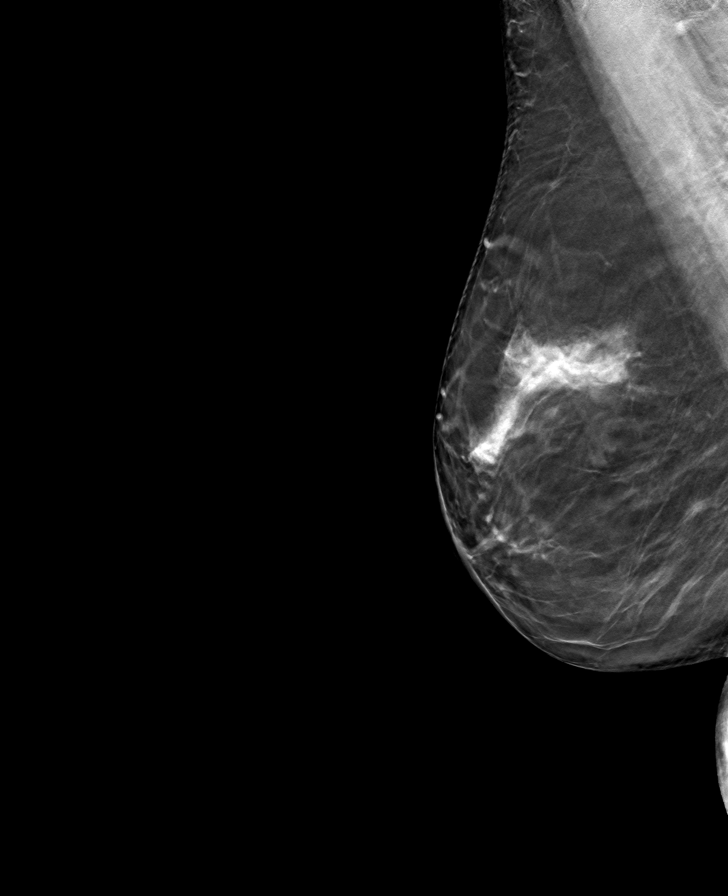

[L MLO tomo · tomo slice 31/62.0]
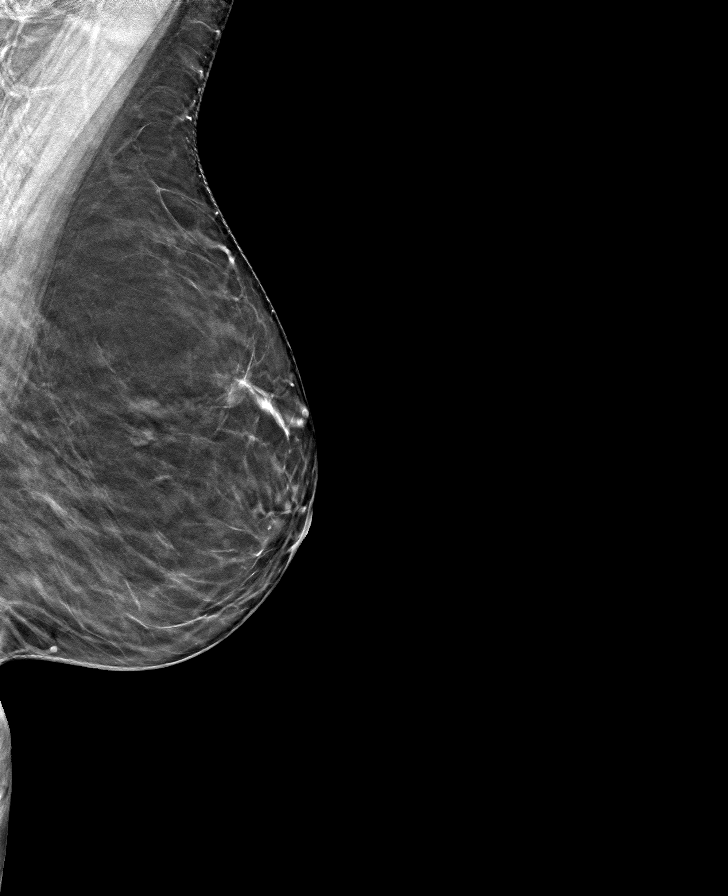

[L CC tomo · tomo slice 33/64.0]
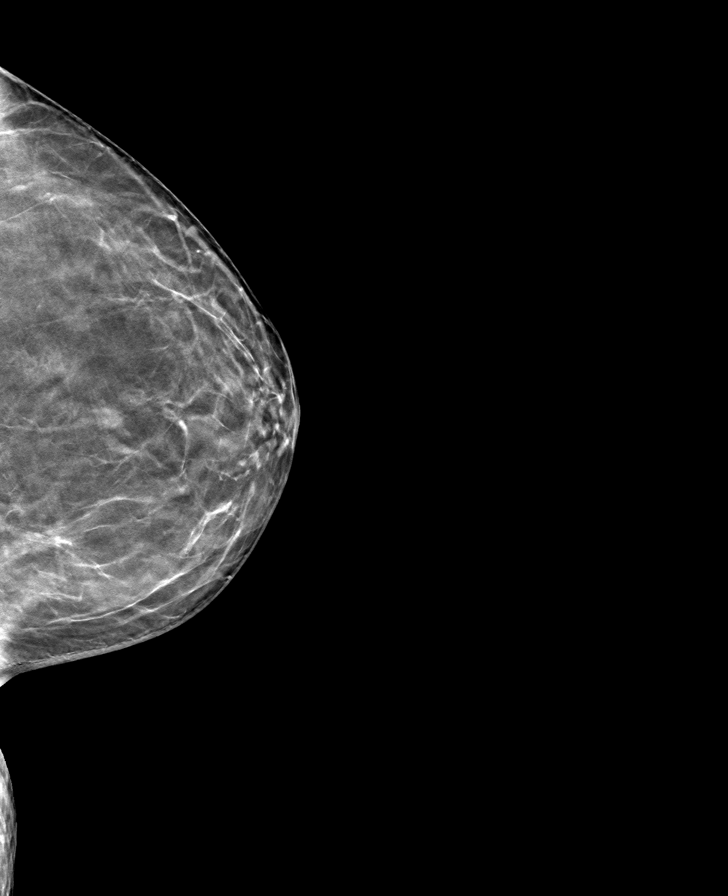

[R CC tomo · tomo slice 34/67.0]
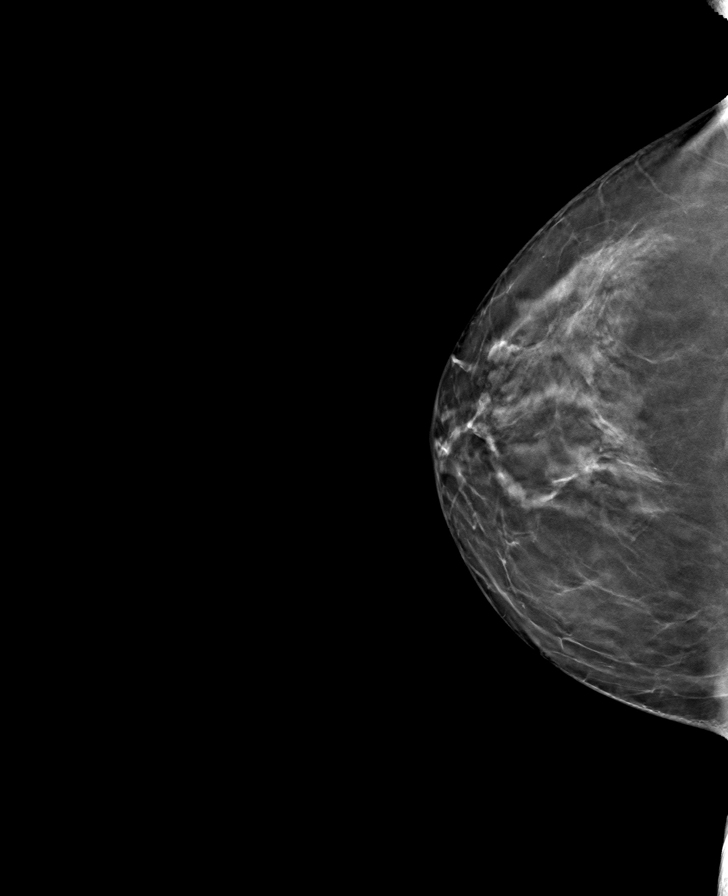

[8 of 24 positions shown; findings below may reference images not displayed]

ACR Breast Density Category b: There are scattered areas of
fibroglandular density.
FINDINGS: There are no findings suspicious for malignancy.
IMPRESSION: No mammographic evidence of malignancy. A result letter of this
screening mammogram will be mailed directly to the patient.

RECOMMENDATION:
Screening mammogram in one year. (Code:[id number]-O-LD2)

BI-RADS CATEGORY  1: Negative.

## 2023-11-21 ENCOUNTER — Other Ambulatory Visit (HOSPITAL_BASED_OUTPATIENT_CLINIC_OR_DEPARTMENT_OTHER): Payer: Self-pay

## 2024-02-20 DIAGNOSIS — H5203 Hypermetropia, bilateral: Secondary | ICD-10-CM | POA: Diagnosis not present

## 2024-02-20 DIAGNOSIS — H43813 Vitreous degeneration, bilateral: Secondary | ICD-10-CM | POA: Diagnosis not present

## 2024-04-06 ENCOUNTER — Other Ambulatory Visit: Payer: Self-pay

## 2024-04-11 DIAGNOSIS — R059 Cough, unspecified: Secondary | ICD-10-CM | POA: Diagnosis not present

## 2024-04-11 DIAGNOSIS — J069 Acute upper respiratory infection, unspecified: Secondary | ICD-10-CM | POA: Diagnosis not present
